# Patient Record
Sex: Female | Born: 1945 | Race: Black or African American | Hispanic: No | Smoking: Never smoker
Health system: Southern US, Community
[De-identification: ages and names within clinical notes are randomized; demographics above are authoritative.]

## PROBLEM LIST (undated history)

## (undated) DIAGNOSIS — C50919 Malignant neoplasm of unspecified site of unspecified female breast: Secondary | ICD-10-CM

## (undated) DIAGNOSIS — C801 Malignant (primary) neoplasm, unspecified: Secondary | ICD-10-CM

## (undated) DIAGNOSIS — I639 Cerebral infarction, unspecified: Secondary | ICD-10-CM

## (undated) DIAGNOSIS — I10 Essential (primary) hypertension: Secondary | ICD-10-CM

## (undated) DIAGNOSIS — Z923 Personal history of irradiation: Secondary | ICD-10-CM

## (undated) DIAGNOSIS — Z9221 Personal history of antineoplastic chemotherapy: Secondary | ICD-10-CM

## (undated) HISTORY — PX: FRACTURE SURGERY: SHX138

## (undated) HISTORY — PX: TONSILLECTOMY: SUR1361

## (undated) HISTORY — PX: ABDOMINAL HYSTERECTOMY: SHX81

## (undated) HISTORY — PX: ANKLE FRACTURE SURGERY: SHX122

## (undated) HISTORY — PX: LEG SURGERY: SHX1003

## (undated) HISTORY — DX: Malignant (primary) neoplasm, unspecified: C80.1

## (undated) HISTORY — PX: PORTACATH PLACEMENT: SHX2246

## (undated) HISTORY — DX: Essential (primary) hypertension: I10

## (undated) HISTORY — DX: Cerebral infarction, unspecified: I63.9

## (undated) HISTORY — PX: FINGER SURGERY: SHX640

## (undated) HISTORY — PX: CHOLECYSTECTOMY: SHX55

## (undated) HISTORY — PX: BREAST SURGERY: SHX581

---

## 1982-10-01 HISTORY — PX: FACIAL FRACTURE SURGERY: SHX1570

## 2005-02-21 ENCOUNTER — Ambulatory Visit: Payer: Self-pay | Admitting: General Practice

## 2006-01-18 ENCOUNTER — Other Ambulatory Visit: Payer: Self-pay

## 2006-01-18 ENCOUNTER — Emergency Department: Payer: Self-pay | Admitting: Emergency Medicine

## 2006-04-18 ENCOUNTER — Ambulatory Visit: Payer: Self-pay | Admitting: General Practice

## 2007-04-24 ENCOUNTER — Ambulatory Visit: Payer: Self-pay | Admitting: Endocrinology

## 2008-06-17 DIAGNOSIS — I10 Essential (primary) hypertension: Secondary | ICD-10-CM | POA: Insufficient documentation

## 2008-06-17 DIAGNOSIS — E669 Obesity, unspecified: Secondary | ICD-10-CM | POA: Insufficient documentation

## 2008-06-17 DIAGNOSIS — E785 Hyperlipidemia, unspecified: Secondary | ICD-10-CM | POA: Insufficient documentation

## 2008-08-10 ENCOUNTER — Ambulatory Visit: Payer: Self-pay

## 2009-02-17 DIAGNOSIS — J309 Allergic rhinitis, unspecified: Secondary | ICD-10-CM | POA: Insufficient documentation

## 2009-08-23 ENCOUNTER — Ambulatory Visit: Payer: Self-pay | Admitting: Nurse Practitioner

## 2009-08-29 ENCOUNTER — Ambulatory Visit: Payer: Self-pay | Admitting: Nurse Practitioner

## 2009-09-29 ENCOUNTER — Ambulatory Visit: Payer: Self-pay | Admitting: Surgery

## 2009-10-01 ENCOUNTER — Ambulatory Visit: Payer: Self-pay | Admitting: Oncology

## 2009-10-01 DIAGNOSIS — C50919 Malignant neoplasm of unspecified site of unspecified female breast: Secondary | ICD-10-CM

## 2009-10-01 DIAGNOSIS — C801 Malignant (primary) neoplasm, unspecified: Secondary | ICD-10-CM

## 2009-10-01 HISTORY — PX: BREAST LUMPECTOMY: SHX2

## 2009-10-01 HISTORY — PX: BREAST BIOPSY: SHX20

## 2009-10-01 HISTORY — DX: Malignant (primary) neoplasm, unspecified: C80.1

## 2009-10-01 HISTORY — DX: Malignant neoplasm of unspecified site of unspecified female breast: C50.919

## 2009-10-04 ENCOUNTER — Ambulatory Visit: Payer: Self-pay | Admitting: Surgery

## 2009-10-14 ENCOUNTER — Ambulatory Visit: Payer: Self-pay | Admitting: Internal Medicine

## 2009-10-21 ENCOUNTER — Ambulatory Visit: Payer: Self-pay | Admitting: Surgery

## 2009-10-29 ENCOUNTER — Emergency Department: Payer: Self-pay | Admitting: Internal Medicine

## 2009-11-01 ENCOUNTER — Ambulatory Visit: Payer: Self-pay | Admitting: Internal Medicine

## 2009-11-01 ENCOUNTER — Ambulatory Visit: Payer: Self-pay | Admitting: Oncology

## 2009-11-29 ENCOUNTER — Ambulatory Visit: Payer: Self-pay | Admitting: Internal Medicine

## 2009-11-29 ENCOUNTER — Ambulatory Visit: Payer: Self-pay | Admitting: Oncology

## 2009-12-30 ENCOUNTER — Ambulatory Visit: Payer: Self-pay | Admitting: Oncology

## 2009-12-30 ENCOUNTER — Ambulatory Visit: Payer: Self-pay | Admitting: Internal Medicine

## 2010-01-08 ENCOUNTER — Emergency Department: Payer: Self-pay | Admitting: Emergency Medicine

## 2010-01-29 ENCOUNTER — Ambulatory Visit: Payer: Self-pay | Admitting: Oncology

## 2010-01-29 ENCOUNTER — Ambulatory Visit: Payer: Self-pay | Admitting: Internal Medicine

## 2010-02-07 DIAGNOSIS — C50919 Malignant neoplasm of unspecified site of unspecified female breast: Secondary | ICD-10-CM | POA: Insufficient documentation

## 2010-03-01 ENCOUNTER — Ambulatory Visit: Payer: Self-pay | Admitting: Oncology

## 2010-03-01 ENCOUNTER — Ambulatory Visit: Payer: Self-pay | Admitting: Internal Medicine

## 2010-03-31 ENCOUNTER — Ambulatory Visit: Payer: Self-pay | Admitting: Internal Medicine

## 2010-03-31 ENCOUNTER — Ambulatory Visit: Payer: Self-pay | Admitting: Oncology

## 2010-05-01 ENCOUNTER — Ambulatory Visit: Payer: Self-pay | Admitting: Internal Medicine

## 2010-05-01 ENCOUNTER — Ambulatory Visit: Payer: Self-pay | Admitting: Oncology

## 2010-06-01 ENCOUNTER — Ambulatory Visit: Payer: Self-pay | Admitting: Internal Medicine

## 2010-06-01 ENCOUNTER — Ambulatory Visit: Payer: Self-pay | Admitting: Oncology

## 2010-07-01 ENCOUNTER — Ambulatory Visit: Payer: Self-pay | Admitting: Internal Medicine

## 2010-07-01 ENCOUNTER — Ambulatory Visit: Payer: Self-pay | Admitting: Oncology

## 2010-08-01 ENCOUNTER — Ambulatory Visit: Payer: Self-pay | Admitting: Internal Medicine

## 2010-08-31 ENCOUNTER — Ambulatory Visit: Payer: Self-pay | Admitting: Oncology

## 2010-09-18 ENCOUNTER — Ambulatory Visit: Payer: Self-pay | Admitting: Oncology

## 2010-09-28 LAB — CANCER ANTIGEN 27.29

## 2010-10-01 ENCOUNTER — Ambulatory Visit: Payer: Self-pay | Admitting: Oncology

## 2010-10-20 IMAGING — CT CT CHEST-ABD-PELV W/ CM
1 of 2 series · 14 of 31 positions shown, 18 images · non-contrast
Comparison: none

REASON FOR EXAM: staging breast CA
COMMENTS:

[Series 2: soft tissue · axial · 0.78mm/px · z∈[-852,-282]mm · 14 of 128 slices shown, 18 images]
[im 7/128  mediastinal]
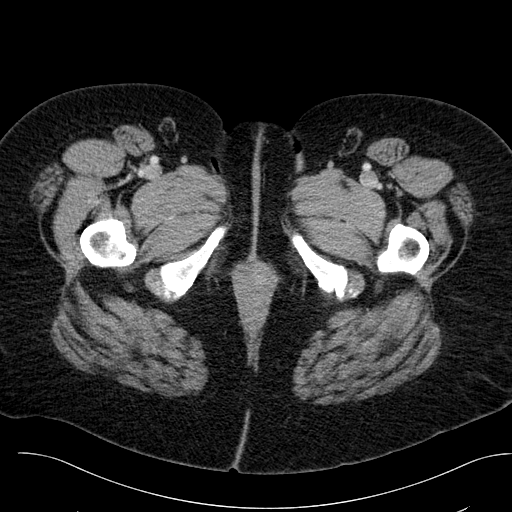
[im 7/128  bone]
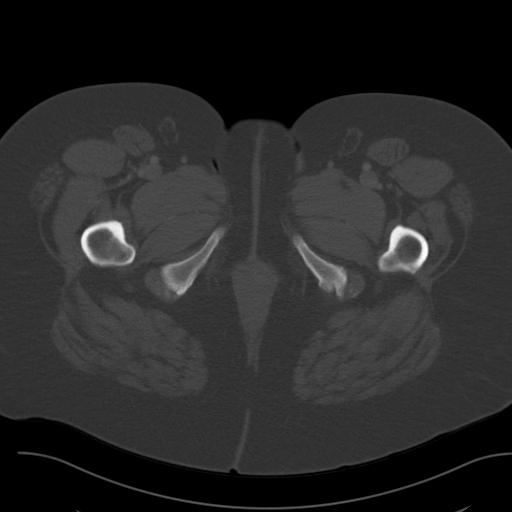
[im 21/128  mediastinal]
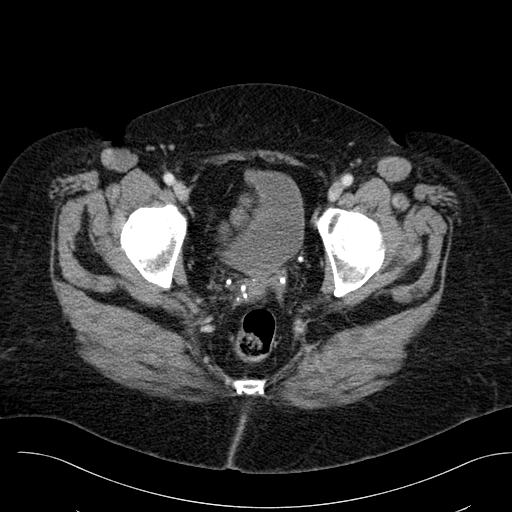
[im 34/128  mediastinal]
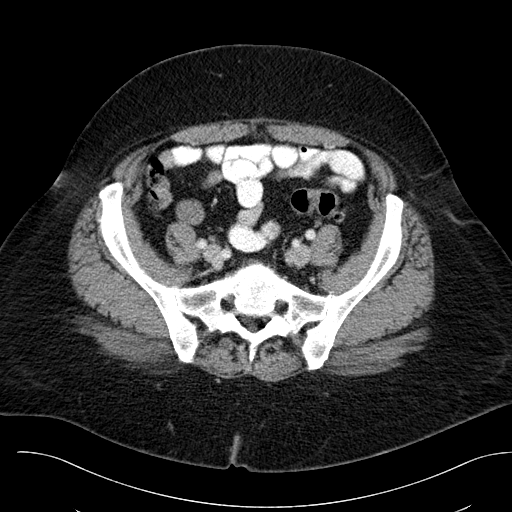
[im 43/128  mediastinal]
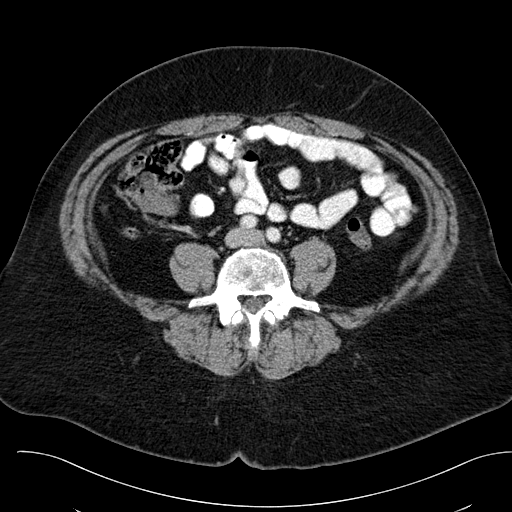
[im 47/128  mediastinal]
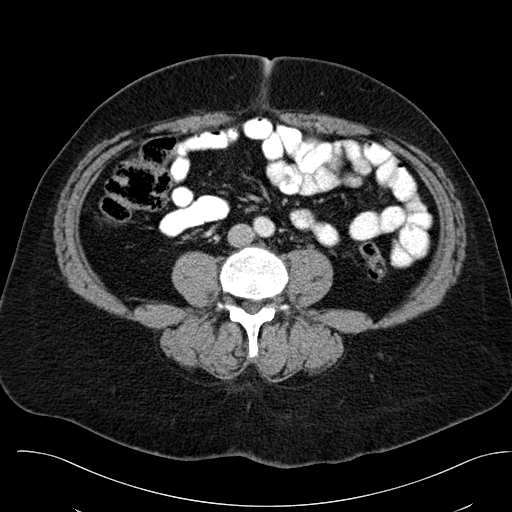
[im 61/128  mediastinal]
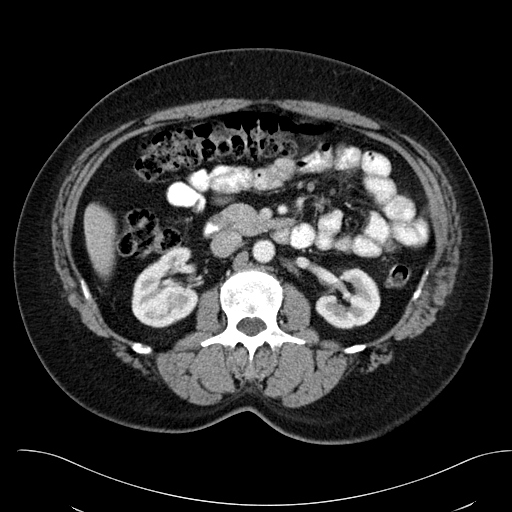
[im 67/128  mediastinal]
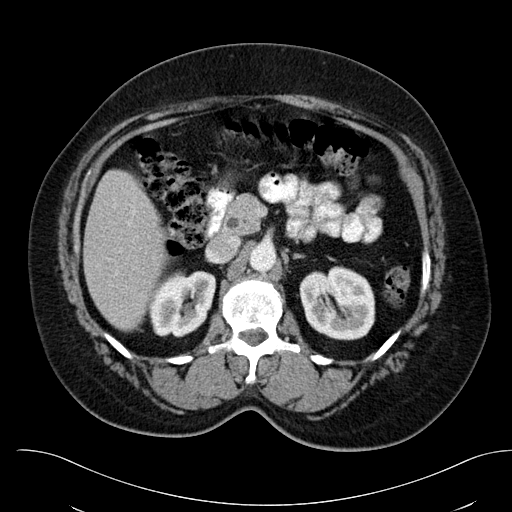
[im 81/128  mediastinal]
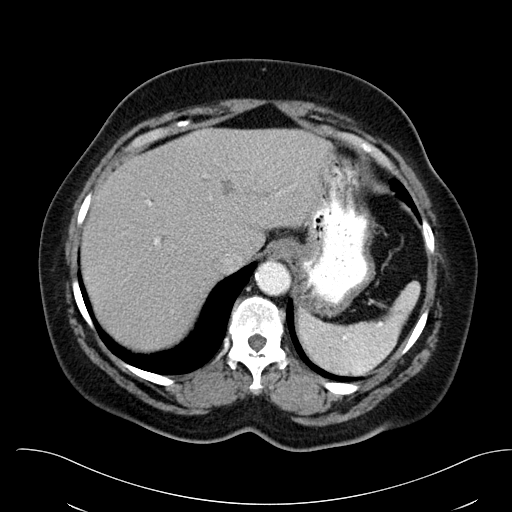
[im 85/128  mediastinal]
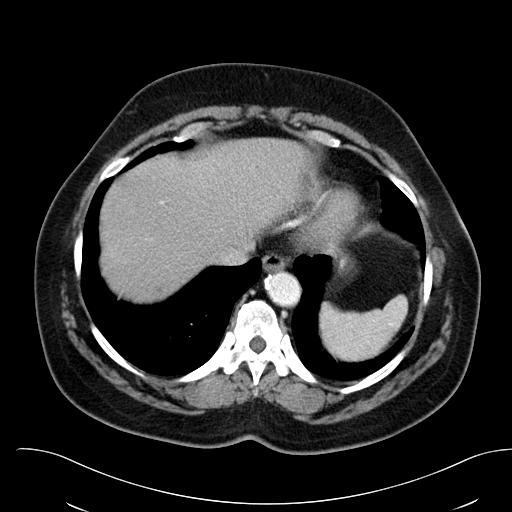
[im 85/128  bone]
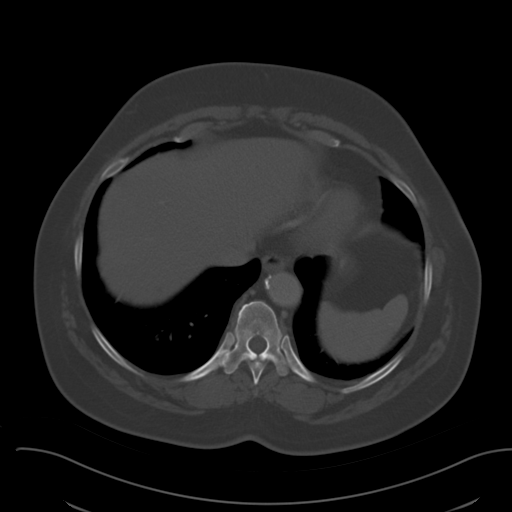
[im 94/128  mediastinal]
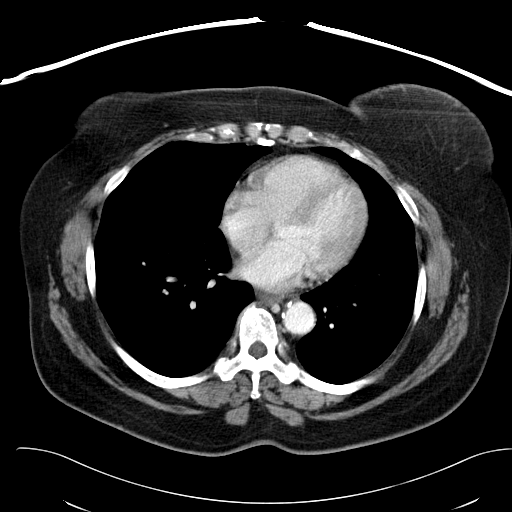
[im 101/128  lung]
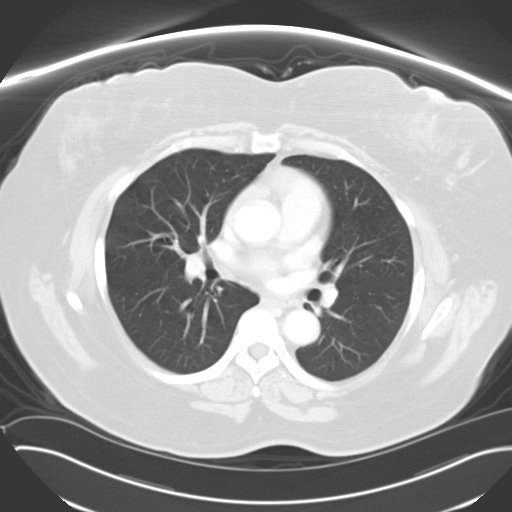
[im 107/128  mediastinal]
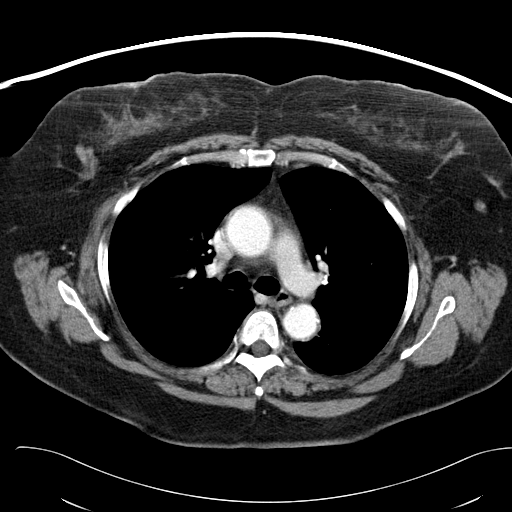
[im 107/128  lung]
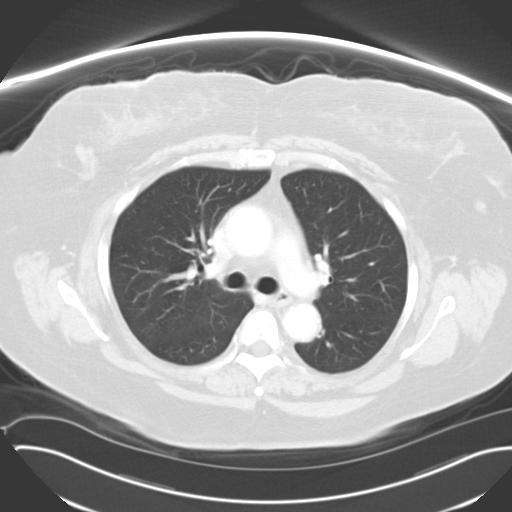
[im 114/128  lung]
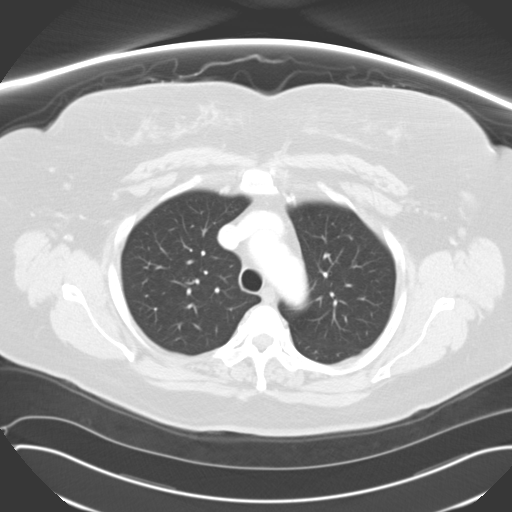
[im 121/128  mediastinal]
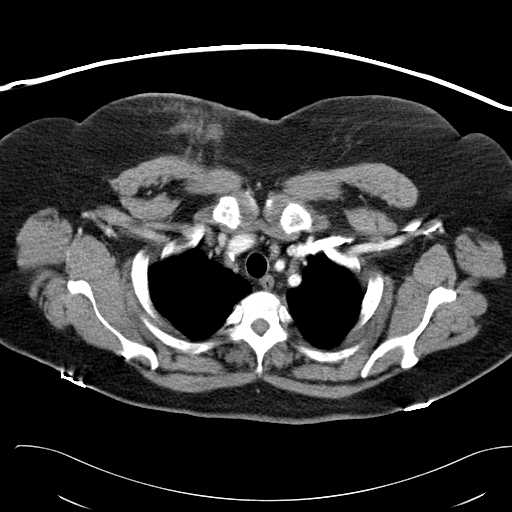
[im 121/128  lung]
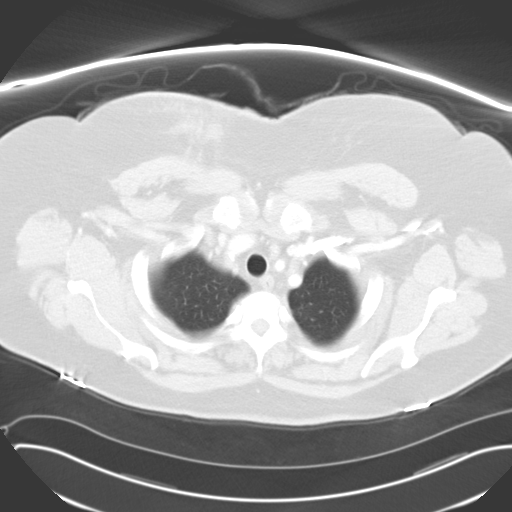

[14 of 31 positions shown; findings below may reference images not displayed]

PROCEDURE:     CT  - CT CHEST ABDOMEN AND PELVIS W  - October 18, 2009  [DATE]

RESULT:     CT of the chest, abdomen and pelvis is performed utilizing 100
ml of Ssovue-X0K iodinated intravenous contrast along with oral contrast.
The patient has no previous exam for comparison. Images are reconstructed in
the axial plane at 5 mm slice thickness. Multiplanar reconstructions were
performed at the time of interpretation by the interpreting physician
utilizing the [employer] software. Again, there is no previous exam for
comparison.

There is some linear fibrosis or atelectasis at both lung bases along with
an irregularly marginated tiny nodular density in the left lung seen in the
area of image #39. This is only 1 to possibly 2 mm in diameter and is likely
fibrotic. Follow-up on subsequent scans is suggested.

Punctate calcification is seen in the superior portion of the spleen
suggestive of granulomatous change. No mediastinal or hilar mass or
adenopathy is present. There is no axillary mass or adenopathy evident. The
bony structures show degenerative changes. The gallbladder wall appears to
be slightly thickened which may be artifactual. Ultrasound correlation is
recommended if the patient has right upper quadrant symptoms. No radiopaque
gallstones are evident. The liver shows no definite focal mass. The kidneys
enhance normally. The adrenal glands, spleen and pancreas are otherwise
unremarkable. There is no abnormal bowel distention. There is no mesenteric
or retroperitoneal mass or adenopathy. No pelvic or inguinal mass or
adenopathy is evident. It appears the images demonstrate a cystic area in
the pelvis in the midline and to the right which measures 2.61 cm. A right
adnexal cyst is suspected. Pelvic ultrasound follow-up is recommended.
Multiple pelvic calcifications suggest phleboliths. The appendix appears
normal. There is no abnormal bowel distention or bowel wall thickening.
IMPRESSION: 1. Unremarkable appearance of the thorax except for the postsurgical changes
in the right breast.
2. Right pelvic cyst, possibly ovarian. Pelvic sonogram follow-up is
recommended.
3. Gallbladder wall appears to be slightly thickened which may be
artifactual or secondary to incomplete distention. Ultrasound correlation
may be beneficial to assess the gallbladder wall.
4. Probable areas of linear atelectasis or fibrosis in the lungs especially
in the lingula. Follow-up is recommended on subsequent scans.

## 2010-10-31 IMAGING — CT CT HEAD WITHOUT CONTRAST
2 series · 16 of 30 positions shown, 20 images · non-contrast
Comparison: none

REASON FOR EXAM: dizzy breast cancer
COMMENTS:

PROCEDURE:     CT  - CT HEAD WITHOUT CONTRAST  - October 29, 2009  [DATE]
RESULT:     Technique: Helical 5mm sections were obtained from the skull
base to the vertex without administration of intravenous contrast.

[Series 2: without · axial · non-contrast · 0.43mm/px · z∈[-178,-43]mm · 13 of 33 slices shown, 17 images]
[im 3/33  brain]
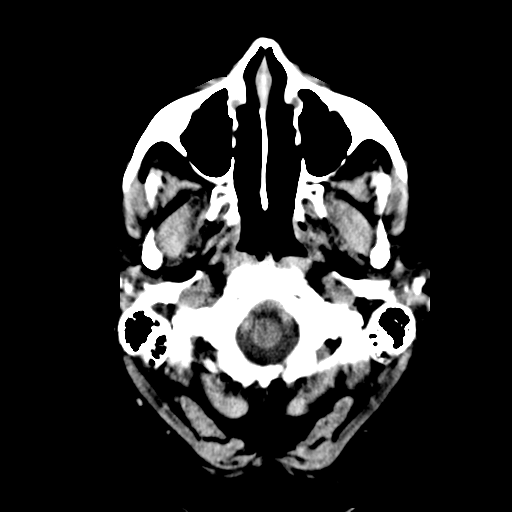
[im 3/33  bone]
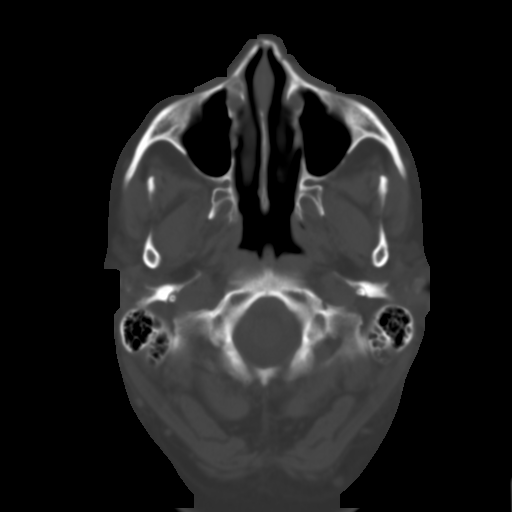
[im 5/33  brain]
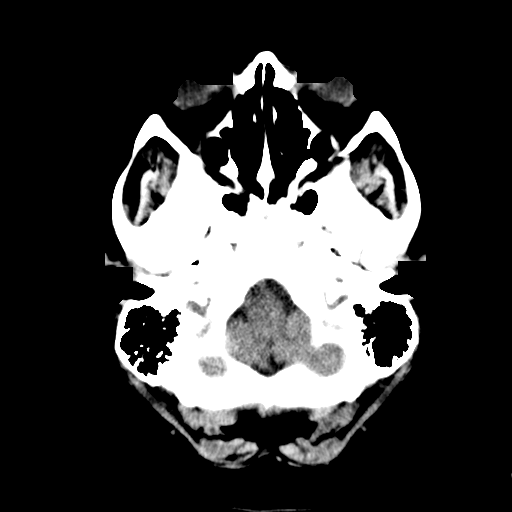
[im 7/33  brain]
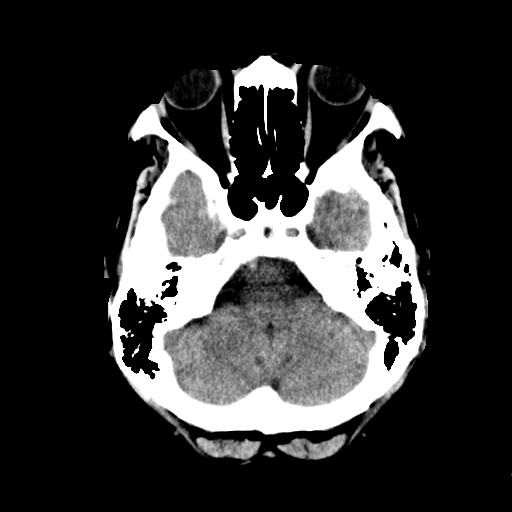
[im 10/33  brain]
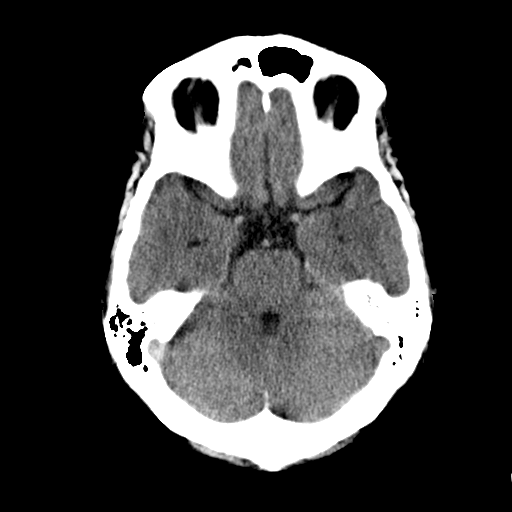
[im 12/33  brain]
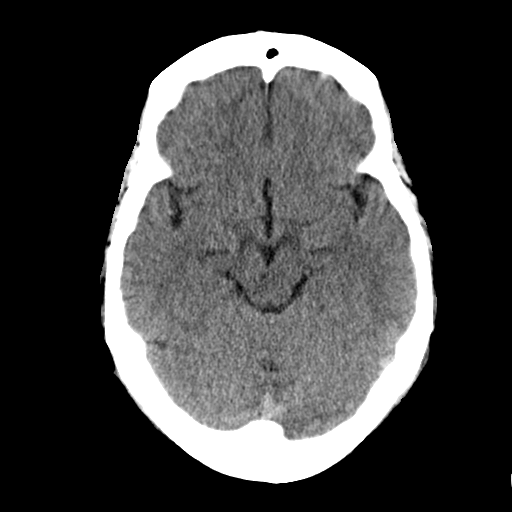
[im 12/33  bone]
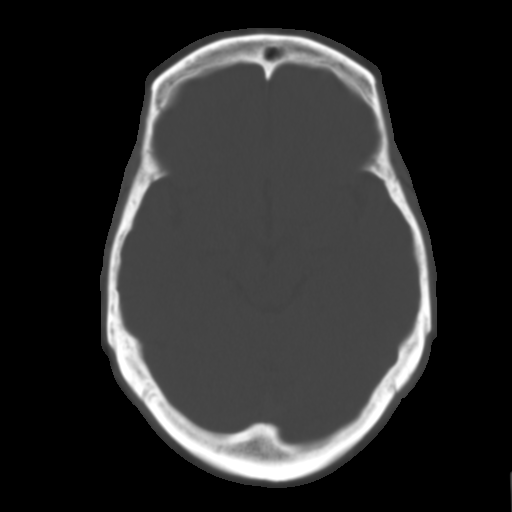
[im 14/33  brain]
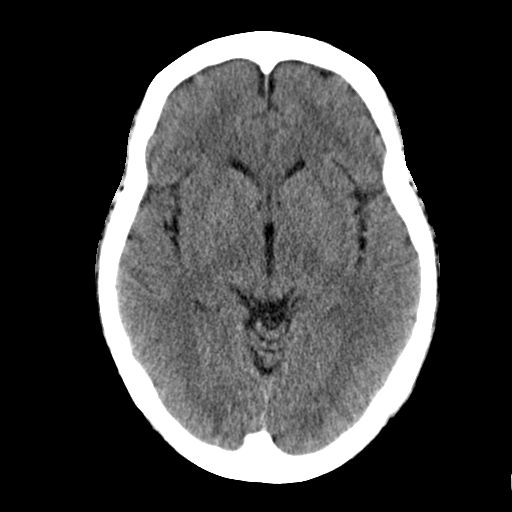
[im 17/33  brain]
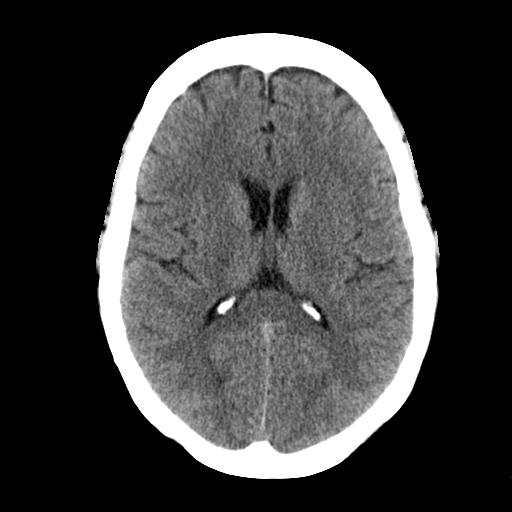
[im 19/33  brain]
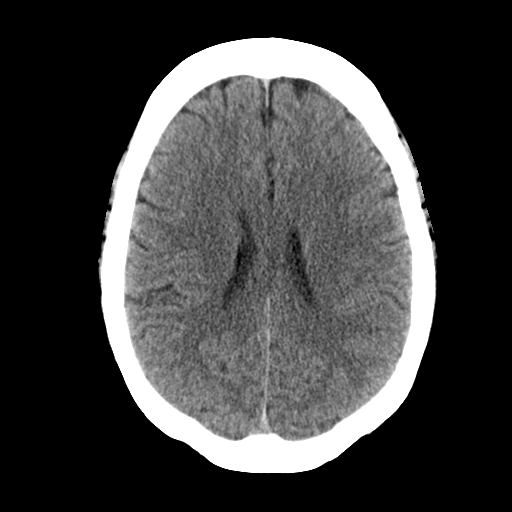
[im 21/33  brain]
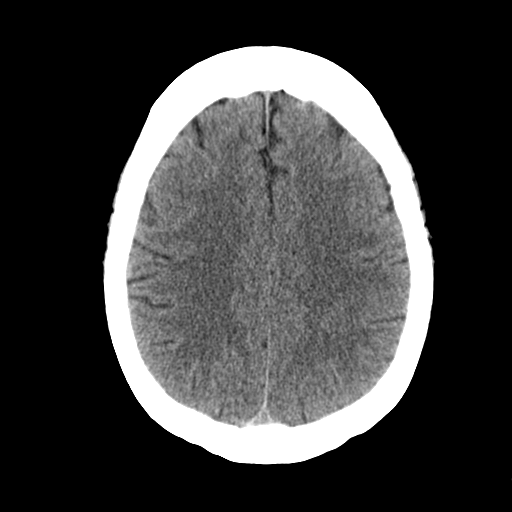
[im 21/33  bone]
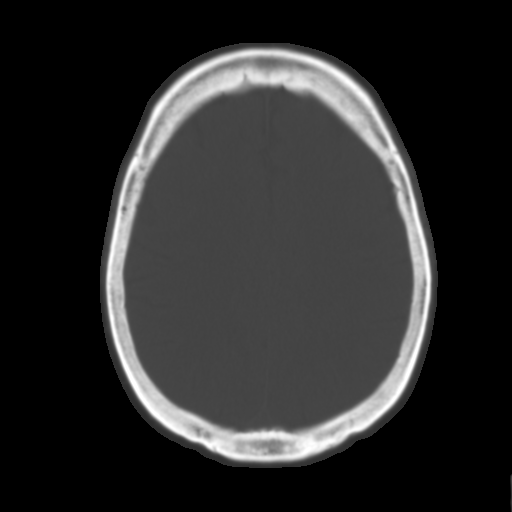
[im 23/33  brain]
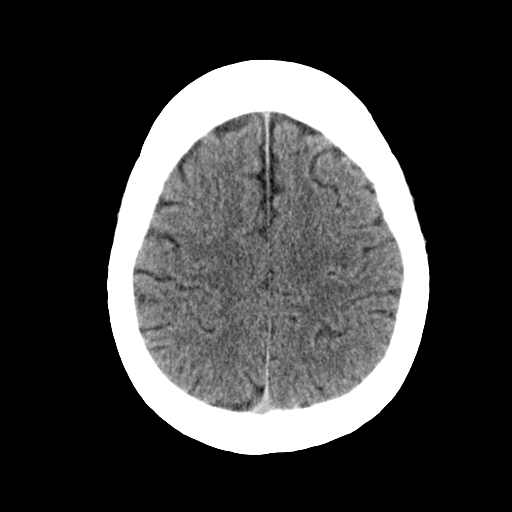
[im 26/33  brain]
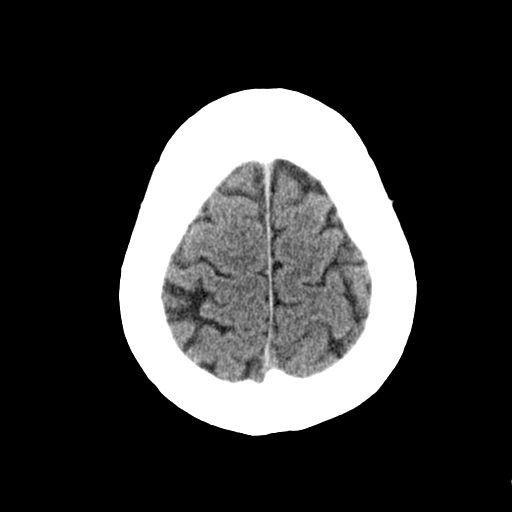
[im 28/33  brain]
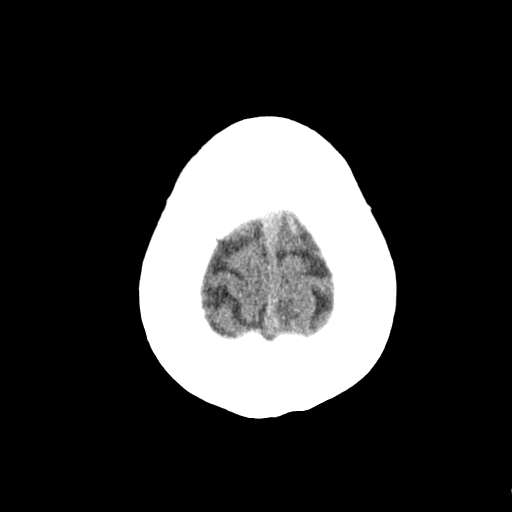
[im 30/33  brain]
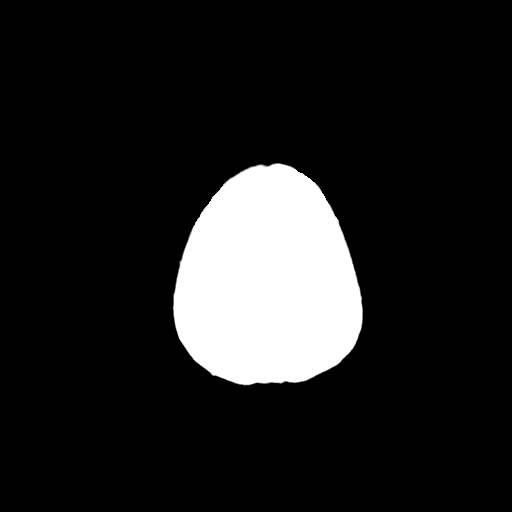
[im 30/33  bone]
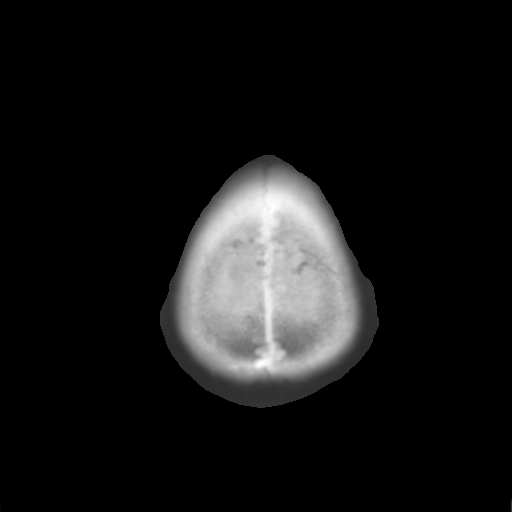

[Series 3: bone · axial · 0.43mm/px · z∈[-178,-133]mm · 3 of 33 slices shown]
[im 3/33  bone]
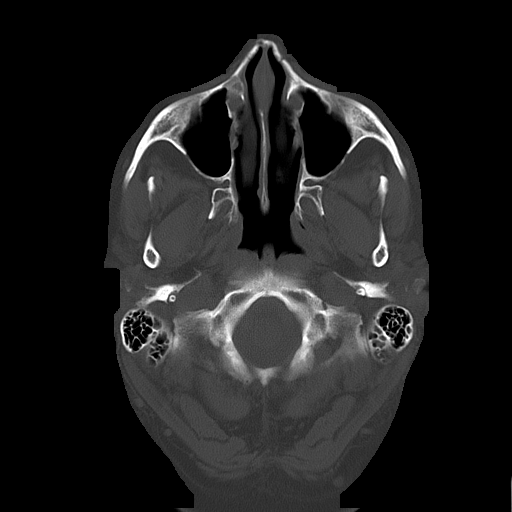
[im 7/33  bone]
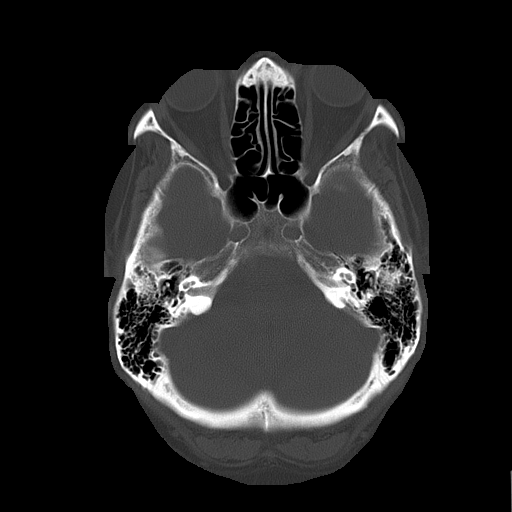
[im 12/33  bone]
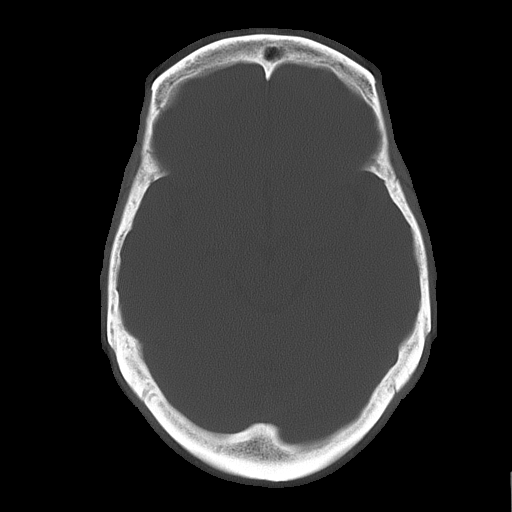

[16 of 30 positions shown; findings below may reference images not displayed]

FINDINGS: There is not evidence of intra-axial fluid collections. There is
no evidence of acute hemorrhage or secondary signs reflecting mass effect or
subacute or chronic focal territorial infarction. The osseous structures
demonstrate no evidence of a depressed skull fracture. If there is
persistent concern clinical follow-up with MRI is recommended.
IMPRESSION: 1. No evidence of acute intracranial abnormalitites.

## 2010-11-01 ENCOUNTER — Ambulatory Visit: Payer: Self-pay | Admitting: Oncology

## 2010-11-23 ENCOUNTER — Inpatient Hospital Stay: Payer: Self-pay | Admitting: Surgery

## 2010-11-24 LAB — CANCER ANTIGEN 27.29: CA 27.29: 26.2 U/mL (ref 0.0–38.6)

## 2010-11-30 ENCOUNTER — Ambulatory Visit: Payer: Self-pay | Admitting: Oncology

## 2010-12-05 ENCOUNTER — Ambulatory Visit: Payer: Self-pay | Admitting: Surgery

## 2010-12-20 ENCOUNTER — Ambulatory Visit: Payer: Self-pay | Admitting: Surgery

## 2010-12-31 ENCOUNTER — Ambulatory Visit: Payer: Self-pay | Admitting: Oncology

## 2011-01-30 ENCOUNTER — Ambulatory Visit: Payer: Self-pay | Admitting: Oncology

## 2011-02-21 LAB — CANCER ANTIGEN 27.29: CA 27.29: 17.4 U/mL (ref 0.0–38.6)

## 2011-03-02 ENCOUNTER — Ambulatory Visit: Payer: Self-pay | Admitting: Oncology

## 2011-04-05 ENCOUNTER — Ambulatory Visit: Payer: Self-pay | Admitting: Oncology

## 2011-05-02 ENCOUNTER — Ambulatory Visit: Payer: Self-pay | Admitting: Oncology

## 2011-05-14 ENCOUNTER — Ambulatory Visit: Payer: Self-pay | Admitting: Oncology

## 2011-06-20 ENCOUNTER — Ambulatory Visit: Payer: Self-pay | Admitting: Oncology

## 2011-07-02 ENCOUNTER — Ambulatory Visit: Payer: Self-pay | Admitting: Oncology

## 2011-09-12 ENCOUNTER — Ambulatory Visit: Payer: Self-pay | Admitting: Oncology

## 2011-10-02 ENCOUNTER — Ambulatory Visit: Payer: Self-pay | Admitting: Oncology

## 2011-11-02 ENCOUNTER — Ambulatory Visit: Payer: Self-pay | Admitting: Oncology

## 2011-12-13 ENCOUNTER — Observation Stay: Payer: Self-pay | Admitting: Internal Medicine

## 2011-12-13 LAB — CBC
HGB: 12.6 g/dL (ref 12.0–16.0)
MCHC: 33.1 g/dL (ref 32.0–36.0)
Platelet: 224 10*3/uL (ref 150–440)
RBC: 4.37 10*6/uL (ref 3.80–5.20)
RDW: 13.9 % (ref 11.5–14.5)
WBC: 7.8 10*3/uL (ref 3.6–11.0)

## 2011-12-13 LAB — COMPREHENSIVE METABOLIC PANEL
Albumin: 4.1 g/dL (ref 3.4–5.0)
Alkaline Phosphatase: 48 U/L — ABNORMAL LOW (ref 50–136)
BUN: 14 mg/dL (ref 7–18)
Bilirubin,Total: 0.6 mg/dL (ref 0.2–1.0)
Chloride: 98 mmol/L (ref 98–107)
Creatinine: 0.62 mg/dL (ref 0.60–1.30)
EGFR (African American): >60
EGFR (Non-African Amer.): >60
Glucose: 106 mg/dL — ABNORMAL HIGH (ref 65–99)
Osmolality: 280 (ref 275–301)
SGOT(AST): 32 U/L (ref 15–37)
SGPT (ALT): 21 U/L

## 2011-12-13 LAB — LIPID PANEL
Ldl Cholesterol, Calc: 173 mg/dL — ABNORMAL HIGH (ref 0–100)
VLDL Cholesterol, Calc: 14 mg/dL (ref 5–40)

## 2011-12-13 LAB — APTT: Activated PTT: 33.7 secs (ref 23.6–35.9)

## 2011-12-13 LAB — PROTIME-INR: Prothrombin Time: 12.6 secs (ref 11.5–14.7)

## 2011-12-13 LAB — TROPONIN I: Troponin-I: <0.02 ng/mL

## 2011-12-14 LAB — CK TOTAL AND CKMB (NOT AT ARMC)
CK, Total: 117 U/L (ref 21–215)
CK-MB: 0.8 ng/mL (ref 0.5–3.6)

## 2011-12-14 LAB — TROPONIN I: Troponin-I: <0.02 ng/mL

## 2011-12-21 ENCOUNTER — Ambulatory Visit: Payer: Self-pay | Admitting: Oncology

## 2011-12-31 ENCOUNTER — Ambulatory Visit: Payer: Self-pay | Admitting: Oncology

## 2011-12-31 DIAGNOSIS — Z8673 Personal history of transient ischemic attack (TIA), and cerebral infarction without residual deficits: Secondary | ICD-10-CM | POA: Insufficient documentation

## 2012-02-13 ENCOUNTER — Ambulatory Visit: Payer: Self-pay | Admitting: Oncology

## 2012-03-01 ENCOUNTER — Ambulatory Visit: Payer: Self-pay | Admitting: Oncology

## 2012-04-15 ENCOUNTER — Ambulatory Visit: Payer: Self-pay | Admitting: Oncology

## 2012-05-07 ENCOUNTER — Ambulatory Visit: Payer: Self-pay | Admitting: Oncology

## 2012-06-01 ENCOUNTER — Ambulatory Visit: Payer: Self-pay | Admitting: Oncology

## 2012-06-19 LAB — CBC CANCER CENTER
Basophil #: 0.1 x10 3/mm (ref 0.0–0.1)
Basophil %: 1 %
Eosinophil %: 2 %
HCT: 36.4 % (ref 35.0–47.0)
HGB: 11.7 g/dL — ABNORMAL LOW (ref 12.0–16.0)
Lymphocyte #: 1.1 x10 3/mm (ref 1.0–3.6)
MCH: 27.8 pg (ref 26.0–34.0)
MCV: 87 fL (ref 80–100)
Monocyte #: 0.4 x10 3/mm (ref 0.2–0.9)
Neutrophil #: 5.3 x10 3/mm (ref 1.4–6.5)
Neutrophil %: 74.9 %
RBC: 4.21 10*6/uL (ref 3.80–5.20)
RDW: 14.5 % (ref 11.5–14.5)

## 2012-06-19 LAB — BASIC METABOLIC PANEL
Anion Gap: 5 — ABNORMAL LOW (ref 7–16)
BUN: 10 mg/dL (ref 7–18)
Creatinine: 0.84 mg/dL (ref 0.60–1.30)
EGFR (Non-African Amer.): >60
Glucose: 110 mg/dL — ABNORMAL HIGH (ref 65–99)
Osmolality: 281 (ref 275–301)
Potassium: 3.7 mmol/L (ref 3.5–5.1)

## 2012-07-01 ENCOUNTER — Ambulatory Visit: Payer: Self-pay | Admitting: Oncology

## 2012-08-01 ENCOUNTER — Ambulatory Visit: Payer: Self-pay | Admitting: Oncology

## 2012-12-16 ENCOUNTER — Ambulatory Visit: Payer: Self-pay | Admitting: Oncology

## 2012-12-30 ENCOUNTER — Ambulatory Visit: Payer: Self-pay | Admitting: Oncology

## 2013-01-29 ENCOUNTER — Ambulatory Visit: Payer: Self-pay | Admitting: Oncology

## 2013-03-01 ENCOUNTER — Ambulatory Visit: Payer: Self-pay | Admitting: Oncology

## 2013-04-16 ENCOUNTER — Ambulatory Visit: Payer: Self-pay | Admitting: Oncology

## 2014-02-03 ENCOUNTER — Ambulatory Visit: Payer: Self-pay | Admitting: Oncology

## 2014-02-04 LAB — CANCER ANTIGEN 27.29: CA 27.29: 15.2 U/mL (ref 0.0–38.6)

## 2014-03-01 ENCOUNTER — Ambulatory Visit: Payer: Self-pay | Admitting: Oncology

## 2014-04-19 ENCOUNTER — Ambulatory Visit: Payer: Self-pay | Admitting: Oncology

## 2014-05-26 ENCOUNTER — Ambulatory Visit: Payer: Self-pay | Admitting: Oncology

## 2014-06-01 ENCOUNTER — Ambulatory Visit: Payer: Self-pay | Admitting: Oncology

## 2014-07-07 ENCOUNTER — Ambulatory Visit: Payer: Self-pay | Admitting: Oncology

## 2014-07-07 LAB — COMPREHENSIVE METABOLIC PANEL
ALBUMIN: 3.5 g/dL (ref 3.4–5.0)
ALK PHOS: 54 U/L
AST: 18 U/L (ref 15–37)
Anion Gap: 6 — ABNORMAL LOW (ref 7–16)
BUN: 14 mg/dL (ref 7–18)
Bilirubin,Total: 0.4 mg/dL (ref 0.2–1.0)
CO2: 32 mmol/L (ref 21–32)
Calcium, Total: 9 mg/dL (ref 8.5–10.1)
Chloride: 102 mmol/L (ref 98–107)
Creatinine: 0.86 mg/dL (ref 0.60–1.30)
EGFR (African American): >60
Glucose: 116 mg/dL — ABNORMAL HIGH (ref 65–99)
Osmolality: 281 (ref 275–301)
Potassium: 3.6 mmol/L (ref 3.5–5.1)
SGPT (ALT): 24 U/L
SODIUM: 140 mmol/L (ref 136–145)
Total Protein: 7.2 g/dL (ref 6.4–8.2)

## 2014-07-07 LAB — CBC CANCER CENTER
BASOS ABS: 0.1 x10 3/mm (ref 0.0–0.1)
Basophil %: 1.3 %
Eosinophil #: 0.2 x10 3/mm (ref 0.0–0.7)
Eosinophil %: 2.6 %
HCT: 36.3 % (ref 35.0–47.0)
HGB: 11.9 g/dL — ABNORMAL LOW (ref 12.0–16.0)
Lymphocyte #: 1.4 x10 3/mm (ref 1.0–3.6)
Lymphocyte %: 18.7 %
MCH: 28 pg (ref 26.0–34.0)
MCHC: 32.7 g/dL (ref 32.0–36.0)
MCV: 86 fL (ref 80–100)
MONO ABS: 0.5 x10 3/mm (ref 0.2–0.9)
Monocyte %: 6.8 %
NEUTROS ABS: 5.3 x10 3/mm (ref 1.4–6.5)
Neutrophil %: 70.6 %
Platelet: 253 x10 3/mm (ref 150–440)
RBC: 4.23 10*6/uL (ref 3.80–5.20)
RDW: 14.3 % (ref 11.5–14.5)
WBC: 7.4 x10 3/mm (ref 3.6–11.0)

## 2014-07-08 LAB — CANCER ANTIGEN 27.29: CA 27.29: 13.9 U/mL (ref 0.0–38.6)

## 2014-08-01 ENCOUNTER — Ambulatory Visit: Payer: Self-pay | Admitting: Oncology

## 2014-08-31 ENCOUNTER — Ambulatory Visit: Payer: Self-pay | Admitting: Oncology

## 2014-11-12 ENCOUNTER — Ambulatory Visit: Payer: Self-pay | Admitting: Oncology

## 2014-11-30 ENCOUNTER — Ambulatory Visit
Admit: 2014-11-30 | Disposition: A | Discharge status: Home / Self Care | Payer: Self-pay | Attending: Oncology | Admitting: Oncology

## 2015-01-14 ENCOUNTER — Other Ambulatory Visit: Payer: Self-pay | Admitting: Oncology

## 2015-01-14 DIAGNOSIS — Z853 Personal history of malignant neoplasm of breast: Secondary | ICD-10-CM

## 2015-01-23 NOTE — H&P (Signed)
PATIENT NAME:  NIKO, JAKEL MR#:  [id number] DATE OF BIRTH:  1946-08-23  DATE OF ADMISSION:  12/13/2011  PRIMARY CARE PHYSICIAN: [facility]   CHIEF COMPLAINT: Difficulty talking with weakness on the left upper and lower extremity today.   HISTORY OF PRESENT ILLNESS: Ms. Oriol is a very pleasant 70 year old African American female with past medical history of breast cancer and history of hypertension with borderline hyperlipidemia comes to the Emergency Room with complaints of weakness in the left upper and lower extremity when she woke up this morning around 7:00 a.m. Patient noted she had difficulty expressing her thoughts and was not able to talk along with weakness in her left arm and lower extremity. She did not have a fall although she did have to hold the wall in her house to get around. She went to [facility], was taken by her neighbor and she was then sent to the Emergency Room for symptoms suggestive of transient ischemic attack/stroke. Patient's symptoms have resolved. Her CT head is negative for stroke. She received a dose of aspirin 325 mg in the Emergency Room. She is hemodynamically stable and being admitted for further evaluation and management.   PAST MEDICAL HISTORY:  1. Hyperlipidemia, does not take any medications.  2. Borderline diabetes, is not on medicine. Patient says she watches her diet.  3. History of breast cancer status post radiation and chemotherapy.  4. Hysterectomy.  5. History of breast surgery in the past.   FAMILY HISTORY: Father died of lung cancer. Mother died of myocardial infarction.   SOCIAL HISTORY: Nonsmoker. Nonalcoholic. Does not work.   REVIEW OF SYSTEMS: CONSTITUTIONAL: No fever, fatigue. Positive for weakness in left upper and lower extremity. EYES: Minimal blurred vision which resolved. No glaucoma or cataracts. ENT: No tinnitus, ear pain, hearing loss. RESPIRATORY: No cough, wheeze, hemoptysis. CARDIOVASCULAR: No chest pain,  orthopnea, edema. GASTROINTESTINAL: No nausea, vomiting, diarrhea, abdominal pain. GENITOURINARY: No dysuria, hematuria. ENDOCRINE: No polyuria, nocturia. HEMATOLOGY: No anemia or easy bruising. SKIN: No acne, rash. MUSCULOSKELETAL: Positive for weakness in left upper and lower extremity. NEUROLOGICAL: Positive for transient ischemic attack with expressive aphasia. PSYCH: No anxiety or depression. All other systems reviewed and negative.   MEDICATIONS:  1. Multivitamin p.o. daily.  2. Lisinopril/hydrochlorothiazide 20/25, 1 tablet daily.  3. Atenolol 25 mg daily.  4. Letrozole 2.5 mg p.o. daily.   PHYSICAL EXAMINATION:  GENERAL: Patient is awake, alert, oriented x3, not in acute distress.   VITAL SIGNS: Afebrile, pulse 82, blood pressure 149/89, sats 98% on room air.   HEENT: Atraumatic, normocephalic. Pupils are equal, round, and reactive to light and accommodation. Extraocular movements intact. Oral mucosa is moist.   NECK: Supple. No JVD. No carotid bruits.   RESPIRATORY: Clear to auscultation bilaterally. No rales, rhonchi, respiratory distress, or labored breathing.   CARDIOVASCULAR: Both the heart sounds are normal. Rate, rhythm is regular. PMI not lateralized. Chest nontender.   EXTREMITIES: Good pedal pulses, good femoral pulses. No lower extremity edema.   ABDOMEN: Soft, benign, nontender. No organomegaly. Positive bowel sounds.   NEUROLOGIC: Patient is right handed. Her cranial nerves II through XII appears grossly intact. No facial droop. She has speech which is clear. Left upper and lower extremity 4+/5 power with reflexes 1+ in both upper and lower extremities. Plantars downgoing. Sensory exam appears to be normal. Gait not tested.   PSYCH: Patient is awake, alert, oriented x3.   LABORATORY, DIAGNOSTIC AND RADIOLOGICAL DATA: CT of the head without contrast: No  evidence of acute ischemic or hemorrhagic infarction.   Chest x-ray: No acute changes identified. There is minimal  discoid atelectasis at the left base. Port-A-Cath is present.   Comprehensive metabolic panel within normal limits. Glucose 106. CBC within normal limits. Magnesium 1.5. PT-INR 12.6 and 0.9.   Troponin less than 0.02.   ASSESSMENT: 69 year old Ms. Kellie Simmering with:  1. Presented with left-sided weakness upper and lower extremity with expressive aphasia that resolved suspicious for transient ischemic attack.  2. Hypertension.  3. History of breast cancer status post surgery, radiation and chemotherapy.  4. Borderline type 2 diabetes.  5. Borderline hyperlipidemia.   PLAN:  1. Admit patient to observation floor.  2. IV fluids 1 liter then stop.  3. Will continue full dose aspirin, atenolol. Hold off on lisinopril hydrochlorothiazide since we will consider allowing some permissive hypertension in the setting of symptoms of transient ischemic attack.  4. Continue letrozole daily.  5. Replace magnesium.  6. Will check a carotid Doppler, echo and MRI of the brain.  7. Physical therapy to see patient.  8. Further work-up according to patient's clinical course. Hospital admission plan was discussed with the patient and her sister who was present in the Emergency Room.   TIME SPENT: 45 minutes.   ____________________________ Hart Rochester Posey Pronto, MD sap:cms D: 12/13/2011 15:20:52 ET T: 12/13/2011 16:00:56 ET  JOB#: [id number] cc: [place] MD ELECTRONICALLY SIGNED 12/14/2011 15:32

## 2015-01-23 NOTE — Discharge Summary (Signed)
PATIENT NAME:  Michelle Manning, BIGGERS MR#:  [id number] DATE OF BIRTH:  20-Jan-1946  DATE OF ADMISSION:  12/13/2011 DATE OF DISCHARGE:  12/14/2011  PRESENTING COMPLAINT: Expressive aphasia with left-sided weakness.   DISCHARGE DIAGNOSES:  1. Transient ischemic attack, resolved.  2. Hypertension.  3. Hyperlipidemia.   CONDITION ON DISCHARGE: Fair. Vitals stable.   MEDICATIONS:  1. Letrozole 2.5 mg p.o. daily.  2. Atenolol 25 mg daily.  3. Hydrochlorothiazide/lisinopril 25/20 one p.o. daily.  4. Multivitamin p.o. daily.  5. Aspirin 325 mg p.o. daily.  6. Pravachol 20 mg at bedtime.   DIET: Low fat regular diet.   FOLLOW-UP: Follow-up with [facility] in 1 to 2 weeks.   LABORATORY, DIAGNOSTIC, AND RADIOLOGICAL DATA: Cardiac enzymes x3 negative. Comprehensive metabolic panel within normal limits. CBC within normal limits. Magnesium 1.5. PT-INR 12.6 and 0.9. Cholesterol 239, LDL 173, HDL 52.   Chest x-ray no acute cardiopulmonary abnormality. CT of the head no acute intracranial abnormality. Ultrasound Doppler carotid bilateral negative for stenosis. MRI of the brain sequelae of chronic small vessel ischemia. No acute intracranial hemorrhage or infarction.   BRIEF SUMMARY OF HOSPITAL COURSE: [name] is a pleasant 69 year old African American female who came into the Emergency Room with:  1. Transient ischemic attack. She presented with symptoms of expressive aphasia and left upper and lower extremity weakness. Her symptoms resolved completely in a couple of hours after she received aspirin in the Emergency Room. Her CT head and MRI of the brain were negative. Carotid Doppler showed no stenosis. She did very well with physical therapy. She was recommended to continue full dose aspirin for now.  2. Hypertension. Home meds were resumed prior to discharge. Some permissive hypertension was allowed.  3. Breast cancer, on Letrozole.  4. Borderline hyperlipidemia. Given symptoms of hyperlipidemia,  she was started on Pravachol. Diet info was provided.   Hospital stay otherwise remained stable.   CODE STATUS: The patient remained a FULL CODE.   TIME SPENT: 40 minutes.   ____________________________ Hart Rochester Posey Pronto, MD sap:drc D: 12/14/2011 14:50:16 ET T: 12/14/2011 15:08:34 ET JOB#: [id number]  cc: Adithi Gammon A. Posey Pronto, MD, <Dictator> [place] MD ELECTRONICALLY SIGNED 12/14/2011 15:32

## 2015-01-25 ENCOUNTER — Ambulatory Visit: Payer: Self-pay

## 2015-01-28 ENCOUNTER — Ambulatory Visit
Admit: 2015-01-28 | Disposition: A | Discharge status: Home / Self Care | Payer: Self-pay | Attending: Anesthesiology | Admitting: Anesthesiology

## 2015-01-28 LAB — POTASSIUM: Potassium: 3.6 mmol/L

## 2015-02-04 ENCOUNTER — Ambulatory Visit: Payer: Medicare HMO | Admitting: Anesthesiology

## 2015-02-04 ENCOUNTER — Encounter
Admission: RE | Disposition: A | Discharge status: Home / Self Care | Payer: Self-pay | Source: Ambulatory Visit | Attending: Surgery

## 2015-02-04 ENCOUNTER — Ambulatory Visit
Admission: RE | Admit: 2015-02-04 | Discharge: 2015-02-04 | Disposition: A | Discharge status: Home / Self Care | Payer: Medicare HMO | Source: Ambulatory Visit | Attending: Surgery | Admitting: Surgery

## 2015-02-04 ENCOUNTER — Encounter: Payer: Self-pay | Admitting: *Deleted

## 2015-02-04 DIAGNOSIS — Z853 Personal history of malignant neoplasm of breast: Secondary | ICD-10-CM | POA: Diagnosis not present

## 2015-02-04 DIAGNOSIS — Z452 Encounter for adjustment and management of vascular access device: Secondary | ICD-10-CM | POA: Diagnosis present

## 2015-02-04 DIAGNOSIS — I10 Essential (primary) hypertension: Secondary | ICD-10-CM | POA: Diagnosis not present

## 2015-02-04 DIAGNOSIS — Z9049 Acquired absence of other specified parts of digestive tract: Secondary | ICD-10-CM | POA: Diagnosis not present

## 2015-02-04 DIAGNOSIS — Z90710 Acquired absence of both cervix and uterus: Secondary | ICD-10-CM | POA: Insufficient documentation

## 2015-02-04 DIAGNOSIS — Z8673 Personal history of transient ischemic attack (TIA), and cerebral infarction without residual deficits: Secondary | ICD-10-CM | POA: Insufficient documentation

## 2015-02-04 HISTORY — PX: PORT-A-CATH REMOVAL: SHX5289

## 2015-02-04 SURGERY — REMOVAL PORT-A-CATH
Anesthesia: Monitor Anesthesia Care | Laterality: Left | Wound class: Clean

## 2015-02-04 MED ORDER — LIDOCAINE HCL 1 % IJ SOLN
INTRAMUSCULAR | Status: DC | PRN
  Administered 2015-02-04: 23 mL

## 2015-02-04 MED ORDER — ONDANSETRON HCL 4 MG/2ML IJ SOLN: 4.0000 mg | Freq: Once | INTRAMUSCULAR | Status: DC | PRN

## 2015-02-04 MED ORDER — LIDOCAINE HCL (PF) 1 % IJ SOLN
INTRAMUSCULAR | Status: AC
  Filled 2015-02-04: qty 30

## 2015-02-04 MED ORDER — FENTANYL CITRATE (PF) 100 MCG/2ML IJ SOLN
25.0000 ug | INTRAMUSCULAR | Status: DC | PRN
  Administered 2015-02-04: 25 ug via INTRAVENOUS

## 2015-02-04 MED ORDER — LIDOCAINE HCL (CARDIAC) 20 MG/ML IV SOLN
INTRAVENOUS | Status: DC | PRN
  Administered 2015-02-04: 100 mg via INTRAVENOUS

## 2015-02-04 MED ORDER — FENTANYL CITRATE (PF) 100 MCG/2ML IJ SOLN
INTRAMUSCULAR | Status: AC
  Filled 2015-02-04: qty 2

## 2015-02-04 MED ORDER — HYDROCODONE-ACETAMINOPHEN 5-325 MG PO TABS: 1.0000 | ORAL_TABLET | Freq: Four times a day (QID) | ORAL | Status: DC | PRN

## 2015-02-04 MED ORDER — SODIUM BICARBONATE 4 % IV SOLN
INTRAVENOUS | Status: AC
  Filled 2015-02-04: qty 5

## 2015-02-04 MED ORDER — MIDAZOLAM HCL 2 MG/2ML IJ SOLN
INTRAMUSCULAR | Status: DC | PRN
  Administered 2015-02-04: 2 mg via INTRAVENOUS

## 2015-02-04 MED ORDER — PROPOFOL 500 MG/50ML IV EMUL
INTRAVENOUS | Status: DC | PRN
  Administered 2015-02-04: 3 mL via INTRAVENOUS

## 2015-02-04 MED ORDER — LACTATED RINGERS IV SOLN
INTRAVENOUS | Status: DC
  Administered 2015-02-04 (×2): via INTRAVENOUS

## 2015-02-04 SURGICAL SUPPLY — 23 items
CANISTER SUCT 1200ML W/VALVE (MISCELLANEOUS) ×2 IMPLANT
CHLORAPREP W/TINT 26ML (MISCELLANEOUS) ×2 IMPLANT
COVER LIGHT HANDLE STERIS (MISCELLANEOUS) ×4 IMPLANT
DRESSING TELFA 4X3 1S ST N-ADH (GAUZE/BANDAGES/DRESSINGS) ×2 IMPLANT
DRSG TEGADERM 4X4.75 (GAUZE/BANDAGES/DRESSINGS) ×2 IMPLANT
DRSG TELFA 3X8 NADH (GAUZE/BANDAGES/DRESSINGS) ×2 IMPLANT
ELECT CAUTERY NEEDLE TIP 1.0 (MISCELLANEOUS) ×2
ELECTRODE CAUTERY NEDL TIP 1.0 (MISCELLANEOUS) ×1 IMPLANT
GLOVE BIO SURGEON STRL SZ7.5 (GLOVE) ×2 IMPLANT
GLOVE INDICATOR 8.0 STRL GRN (GLOVE) ×2 IMPLANT
GOWN STRL REUS W/ TWL LRG LVL3 (GOWN DISPOSABLE) ×2 IMPLANT
GOWN STRL REUS W/TWL LRG LVL3 (GOWN DISPOSABLE) ×2
LABEL OR SOLS (LABEL) ×2 IMPLANT
PACK PORT-A-CATH (MISCELLANEOUS) ×2 IMPLANT
PAD GROUND ADULT SPLIT (MISCELLANEOUS) ×2 IMPLANT
STRAP SAFETY BODY (MISCELLANEOUS) ×2 IMPLANT
STRIP CLOSURE SKIN 1/4X4 (GAUZE/BANDAGES/DRESSINGS) ×2 IMPLANT
SUT ETHILON 4-0 (SUTURE) ×1
SUT ETHILON 4-0 FS2 18XMFL BLK (SUTURE) ×1
SUT VIC AB 3-0 SH 27 (SUTURE) ×1
SUT VIC AB 3-0 SH 27X BRD (SUTURE) ×1 IMPLANT
SUT VIC AB 4-0 FS2 27 (SUTURE) ×2 IMPLANT
SUTURE ETHLN 4-0 FS2 18XMF BLK (SUTURE) ×1 IMPLANT

## 2015-02-04 NOTE — Anesthesia Preprocedure Evaluation (Signed)
Anesthesia Evaluation  Patient identified by MRN, date of birth, ID band Patient awake    Reviewed: Allergy & Precautions, NPO status , Patient's Chart, lab work & pertinent test results, reviewed documented beta blocker date and time   Airway Mallampati: II  TM Distance: >3 FB Neck ROM: Full    Dental  (+) Upper Dentures, Lower Dentures   Pulmonary          Cardiovascular hypertension,     Neuro/Psych    GI/Hepatic   Endo/Other    Renal/GU      Musculoskeletal   Abdominal   Peds  Hematology   Anesthesia Other Findings   Reproductive/Obstetrics                             Anesthesia Physical Anesthesia Plan  ASA: III  Anesthesia Plan: MAC   Post-op Pain Management:    Induction: Intravenous  Airway Management Planned: Nasal Cannula  Additional Equipment:   Intra-op Plan:   Post-operative Plan:   Informed Consent: I have reviewed the patients History and Physical, chart, labs and discussed the procedure including the risks, benefits and alternatives for the proposed anesthesia with the patient or authorized representative who has indicated his/her understanding and acceptance.     Plan Discussed with: CRNA and Surgeon  Anesthesia Plan Comments:         Anesthesia Quick Evaluation

## 2015-02-04 NOTE — Anesthesia Postprocedure Evaluation (Signed)
  Anesthesia Post-op Note  Patient: Michelle Manning  Procedure(s) Performed: Procedure(s): REMOVAL PORT-A-CATH (Left)  Anesthesia type:MAC  Patient location: PACU  Post pain: Pain level controlled  Post assessment: Post-op Vital signs reviewed, Patient's Cardiovascular Status Stable, Respiratory Function Stable, Patent Airway and No signs of Nausea or vomiting  Post vital signs: Reviewed and stable  Last Vitals:  Filed Vitals:   02/04/15 1039  BP: 140/80  Pulse: 63  Temp: 36.8 C  Resp: 15    Level of consciousness: awake, alert  and patient cooperative  Complications: No apparent anesthesia complications

## 2015-02-04 NOTE — Transfer of Care (Signed)
Immediate Anesthesia Transfer of Care Note  Patient: Michelle Manning  Procedure(s) Performed: Procedure(s): REMOVAL PORT-A-CATH (Left)  Patient Location: PACU  Anesthesia Type:MAC  Level of Consciousness: awake, alert , oriented and patient cooperative  Airway & Oxygen Therapy: Patient Spontanous Breathing and Patient connected to face mask oxygen  Post-op Assessment: Report given to RN, Post -op Vital signs reviewed and stable and Patient moving all extremities X 4  Post vital signs: Reviewed and stable  Last Vitals:  Filed Vitals:   02/04/15 1008  BP: 141/75  Pulse: 65  Temp: 36.3 C  Resp: 19    Complications: No apparent anesthesia complications

## 2015-02-04 NOTE — Op Note (Signed)
02/04/2015  10:01 AM  PATIENT:  Michelle Manning  69 y.o. female  PRE-OPERATIVE DIAGNOSIS:  NO LONGER NEEDED  POST-OPERATIVE DIAGNOSIS:  NO LONGER NEEDED  PROCEDURE:  Procedure(s): REMOVAL PORT-A-CATH (Left)  SURGEON:  Surgeon(s) and Role:    * III Dia Crawford, MD - Primary  PHYSICIAN ASSISTANT:   ASSISTANTS: none   ANESTHESIA:   IV sedation  EBL:     BLOOD ADMINISTERED:none  DRAINS: none   LOCAL MEDICATIONS USED:  XYLOCAINE   SPECIMEN:  No Specimen  DISPOSITION OF SPECIMEN:  N/A  COUNTS:  YES  TOURNIQUET:  * No tourniquets in log *  DICTATION: .Dragon Dictation with the patient supine position and after induction of appropriate IV sedation, the patient's left chest was prepped with ChloraPrep and draped sterile towels. 1% Xylocaine buffered with sodium bicarbonate was injected over the palpable Port-A-Cath device. The area was incised and the incision carried down through the subcutaneous space tissue using the Bovie electrocautery. The device was identified and the capsule incised over the device and the device removed without difficulty. The Port-A-Cath and the catheter appeared to be intact without evidence of infection. The specimen was photographed. The capsule was oversewn using interrupted sutures of 3-0 Vicryl, the subcutaneous space was obliterated with 3-0 Vicryl and the skin was closed with 4-0 nylon in a vertical mattress fashion. Sterile dressings were applied and the patient returned to the recovery room in satisfactory condition.  PLAN OF CARE: Discharge to home after PACU  PATIENT DISPOSITION:  PACU - hemodynamically stable.   Delay start of Pharmacological VTE agent (>24hrs) due to surgical blood loss or risk of bleeding: not applicable   ELY III, Waneda Klammer, MD

## 2015-02-04 NOTE — H&P (Signed)
Michelle Manning is a 69 y.o. female who presents today for port removal  HPI: She has completed her course of chemotherapy and is prepared to have port removed  Past Medical History  Diagnosis Date  . Cancer     breast  . Hypertension   . Stroke     tia few years ago   Past Surgical History  Procedure Laterality Date  . Portacath placement    . Tonsillectomy    . Cholecystectomy    . Abdominal hysterectomy    . Breast surgery      times 2   History   Social History  . Marital Status: Divorced    Spouse Name: N/A  . Number of Children: N/A  . Years of Education: N/A   Social History Main Topics  . Smoking status: Never Smoker   . Smokeless tobacco: Never Used  . Alcohol Use: No  . Drug Use: No  . Sexual Activity: No   Other Topics Concern  . None   Social History Narrative    Review of Systems: See HPI, otherwise negative ROS  PHYSICAL EXAM: BP 159/69 mmHg  Pulse 64  Temp(Src)  (Oral)  Resp 16  Ht 5\' 4"  (1.626 m)  Wt 99.791 kg (220 lb)  BMI 37.74 kg/m2  SpO2 100%  General:   Alert,  pleasant and cooperative in NAD Head:  Normocephalic and atraumatic. Neck:  Supple; no masses or thyromegaly. Lungs:  Clear throughout to auscultation.    Heart:  Regular rate and rhythm. Abdomen:  Soft, nontender and nondistended. Normal bowel sounds, without guarding, and without rebound.   Neurologic:  Alert and  oriented x4;  grossly normal neurologically. Port is in left chest  Impression/Plan: [name] is here for a port removal.2 Risks, benefits, limitations, and alternatives regarding  Port removal have been reviewed with the patient.  Questions have been answered.  All parties agreeable.   Jeanie Cooks, MD  02/04/2015, 9:26 AM

## 2015-02-04 NOTE — H&P (Signed)
H and P has been scanned in to date and is unchanged pre op.  Questions discussed and answered.

## 2015-02-08 ENCOUNTER — Encounter: Payer: Self-pay | Admitting: Surgery

## 2015-04-22 ENCOUNTER — Ambulatory Visit: Payer: Medicare HMO | Attending: Oncology

## 2015-04-22 ENCOUNTER — Other Ambulatory Visit: Payer: Self-pay

## 2015-06-16 ENCOUNTER — Other Ambulatory Visit: Payer: Self-pay | Admitting: *Deleted

## 2015-06-16 DIAGNOSIS — C50919 Malignant neoplasm of unspecified site of unspecified female breast: Secondary | ICD-10-CM

## 2015-06-17 ENCOUNTER — Other Ambulatory Visit: Payer: Self-pay

## 2015-06-17 ENCOUNTER — Ambulatory Visit: Payer: Self-pay | Admitting: Oncology

## 2015-07-01 ENCOUNTER — Other Ambulatory Visit: Payer: Self-pay

## 2015-07-01 ENCOUNTER — Ambulatory Visit: Payer: Self-pay | Admitting: Oncology

## 2015-07-08 ENCOUNTER — Ambulatory Visit: Payer: Self-pay | Admitting: Oncology

## 2015-07-08 ENCOUNTER — Other Ambulatory Visit: Payer: Self-pay

## 2015-07-22 ENCOUNTER — Inpatient Hospital Stay: Payer: Medicare HMO

## 2015-07-22 ENCOUNTER — Inpatient Hospital Stay: Payer: Medicare HMO | Attending: Oncology

## 2015-07-22 ENCOUNTER — Encounter: Payer: Self-pay | Admitting: Oncology

## 2015-07-22 ENCOUNTER — Inpatient Hospital Stay (HOSPITAL_BASED_OUTPATIENT_CLINIC_OR_DEPARTMENT_OTHER): Payer: Medicare HMO | Admitting: Oncology

## 2015-07-22 DIAGNOSIS — I10 Essential (primary) hypertension: Secondary | ICD-10-CM | POA: Insufficient documentation

## 2015-07-22 DIAGNOSIS — Z8673 Personal history of transient ischemic attack (TIA), and cerebral infarction without residual deficits: Secondary | ICD-10-CM | POA: Insufficient documentation

## 2015-07-22 DIAGNOSIS — Z853 Personal history of malignant neoplasm of breast: Secondary | ICD-10-CM | POA: Diagnosis present

## 2015-07-22 DIAGNOSIS — Z79899 Other long term (current) drug therapy: Secondary | ICD-10-CM | POA: Insufficient documentation

## 2015-07-22 DIAGNOSIS — Z170 Estrogen receptor positive status [ER+]: Secondary | ICD-10-CM | POA: Insufficient documentation

## 2015-07-22 DIAGNOSIS — Z9223 Personal history of estrogen therapy: Secondary | ICD-10-CM

## 2015-07-22 DIAGNOSIS — C50919 Malignant neoplasm of unspecified site of unspecified female breast: Secondary | ICD-10-CM

## 2015-07-22 DIAGNOSIS — Z7982 Long term (current) use of aspirin: Secondary | ICD-10-CM | POA: Insufficient documentation

## 2015-07-22 DIAGNOSIS — C50911 Malignant neoplasm of unspecified site of right female breast: Secondary | ICD-10-CM

## 2015-07-22 NOTE — Progress Notes (Signed)
Patient offers no problems or concerns today.

## 2015-07-23 LAB — CANCER ANTIGEN 27.29: CA 27.29: 12.7 U/mL (ref 0.0–38.6)

## 2015-08-05 NOTE — Progress Notes (Signed)
[name]  [phone] [phone]) [phone]  ID: Michelle Manning OB: [birth year]  MR#: [id number]  HWT#:[id number]  Patient Care Team: Everett Graff, MD as PCP - General (Obstetrics and Gynecology)  CHIEF COMPLAINT:  Chief Complaint  Patient presents with  . Breast Cancer    INTERVAL HISTORY: Patient returns to clinic today for routine follow-up. She was last evaluated in clinic in February 2016. She recently completed her 5 years of that result in September 2016. She currently feels well and is asymptomatic. She continues to remain very active.  She does not complain of weakness and fatigue. She denies any fevers or chills. She denies any nausea or vomiting.  She denies any cough or chest pain.  She has good appetite and has maintained her weight.  Patient offers no specific complaints today.   REVIEW OF SYSTEMS:   Review of Systems  Constitutional: Negative.  Negative for fever and malaise/fatigue.  Respiratory: Negative.   Cardiovascular: Negative.   Gastrointestinal: Negative.   Musculoskeletal: Negative.   Neurological: Negative.  Negative for sensory change and weakness.    As per HPI. Otherwise, a complete review of systems is negatve.  PAST MEDICAL HISTORY: Past Medical History  Diagnosis Date  . Cancer (HCC)     breast  . Hypertension   . Stroke ([place])     tia few years ago    PAST SURGICAL HISTORY: Past Surgical History  Procedure Laterality Date  . Portacath placement    . Tonsillectomy    . Cholecystectomy    . Abdominal hysterectomy    . Breast surgery      times 2  . Port-a-cath removal Left 02/04/2015    Procedure: REMOVAL PORT-A-CATH;  Surgeon: III Dia Crawford, MD;  Location: [facility] ORS;  Service: General;  Laterality: Left;    FAMILY HISTORY History reviewed. No pertinent family history.     ADVANCED DIRECTIVES:    HEALTH MAINTENANCE: Social History  Substance Use Topics  . Smoking status: Never Smoker   .  Smokeless tobacco: Never Used  . Alcohol Use: No     Colonoscopy:  PAP:  Bone density:  Lipid panel:  Allergies  Allergen Reactions  . Peanuts [Peanut Oil] Hives    Current Outpatient Prescriptions  Medication Sig Dispense Refill  . ALPRAZolam (NIRAVAM) 0.25 MG dissolvable tablet Take 0.25 mg by mouth 2 (two) times daily as needed for anxiety.    Marland Kitchen aspirin 325 MG tablet Take 325 mg by mouth daily.    Marland Kitchen atenolol (TENORMIN) 25 MG tablet Take by mouth daily.    Marland Kitchen HYDROcodone-acetaminophen (NORCO/VICODIN) 5-325 MG per tablet Take 1 tablet by mouth every 6 (six) hours as needed for moderate pain. 30 tablet 0  . ibuprofen (ADVIL,MOTRIN) 200 MG tablet Take 600 mg by mouth every 8 (eight) hours as needed.    Marland Kitchen letrozole (FEMARA) 2.5 MG tablet Take 2.5 mg by mouth daily.    Marland Kitchen lisinopril-hydrochlorothiazide (PRINZIDE,ZESTORETIC) 20-25 MG per tablet Take 1 tablet by mouth daily.    . Multiple Vitamin (MULTIVITAMIN) capsule Take 1 capsule by mouth daily.    Marland Kitchen omeprazole (PRILOSEC) 20 MG capsule Take 20 mg by mouth daily.    . pravastatin (PRAVACHOL) 20 MG tablet Take 20 mg by mouth daily.    . sertraline (ZOLOFT) 25 MG tablet Take 25 mg by mouth daily.     No current facility-administered medications for this visit.    OBJECTIVE: There were no vitals filed for this visit.  There is no weight on file to calculate BMI.    ECOG FS:0 - Asymptomatic  General: Well-developed, well-nourished, no acute distress. Eyes: Pink conjunctiva, anicteric sclera. Breasts: Patient requested exam be deferred today. Lungs: Clear to auscultation bilaterally. Heart: Regular rate and rhythm. No rubs, murmurs, or gallops. Abdomen: Soft, nontender, nondistended. No organomegaly noted, normoactive bowel sounds. Musculoskeletal: No edema, cyanosis, or clubbing. Neuro: Alert, answering all questions appropriately. Cranial nerves grossly intact. Skin: No rashes or petechiae noted. Psych: Normal affect.   LAB  RESULTS:  Lab Results  Component Value Date   NA 140 07/07/2014   K 3.6 01/28/2015   CL 102 07/07/2014   CO2 32 07/07/2014   GLUCOSE 116* 07/07/2014   BUN 14 07/07/2014   CREATININE 0.86 07/07/2014   CALCIUM 9.0 07/07/2014   PROT 7.2 07/07/2014   ALBUMIN 3.5 07/07/2014   AST 18 07/07/2014   ALT 24 07/07/2014   ALKPHOS 54 07/07/2014   BILITOT 0.4 07/07/2014   GFRNONAA >60 07/07/2014   GFRAA >60 07/07/2014    Lab Results  Component Value Date   WBC 7.4 07/07/2014   NEUTROABS 5.3 07/07/2014   HGB 11.9* 07/07/2014   HCT 36.3 07/07/2014   MCV 86 07/07/2014   PLT 253 07/07/2014     STUDIES: No results found.  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  PLAN:    1.  Breast cancer: No evidence of disease.  Patient has completed 5 year of letrozole in September 2016. Patient has not had a mammogram since April 19, 2014 which was reported as BI-RADS 2.  Bone mineral density on Feb 10, 2014 was reported as normal. Patient will require mammogram in the near future. Return to clinic in 1 year with repeat laboratory work and further evaluation.  Patient expressed understanding and was in agreement with this plan. She also understands that She can call clinic at any time with any questions, concerns, or complaints.    Lloyd Huger, MD   08/05/2015 11:57 AM

## 2015-10-07 ENCOUNTER — Other Ambulatory Visit: Payer: Self-pay | Admitting: Oncology

## 2015-10-07 ENCOUNTER — Ambulatory Visit
Admission: RE | Admit: 2015-10-07 | Discharge: 2015-10-07 | Disposition: A | Discharge status: Home / Self Care | Payer: Medicare HMO | Source: Ambulatory Visit | Attending: Oncology | Admitting: Oncology

## 2015-10-07 ENCOUNTER — Ambulatory Visit: Admission: RE | Admit: 2015-10-07 | Payer: Medicare HMO | Source: Ambulatory Visit

## 2015-10-07 DIAGNOSIS — Z853 Personal history of malignant neoplasm of breast: Secondary | ICD-10-CM | POA: Insufficient documentation

## 2015-10-07 DIAGNOSIS — C50911 Malignant neoplasm of unspecified site of right female breast: Secondary | ICD-10-CM

## 2016-03-12 ENCOUNTER — Emergency Department
Admission: EM | Admit: 2016-03-12 | Discharge: 2016-03-12 | Disposition: A | Discharge status: Home / Self Care | Payer: Medicare HMO | Attending: Emergency Medicine | Admitting: Emergency Medicine

## 2016-03-12 DIAGNOSIS — R21 Rash and other nonspecific skin eruption: Secondary | ICD-10-CM

## 2016-03-12 DIAGNOSIS — Z8673 Personal history of transient ischemic attack (TIA), and cerebral infarction without residual deficits: Secondary | ICD-10-CM | POA: Insufficient documentation

## 2016-03-12 DIAGNOSIS — T7840XA Allergy, unspecified, initial encounter: Secondary | ICD-10-CM | POA: Diagnosis present

## 2016-03-12 DIAGNOSIS — I10 Essential (primary) hypertension: Secondary | ICD-10-CM | POA: Insufficient documentation

## 2016-03-12 DIAGNOSIS — L299 Pruritus, unspecified: Secondary | ICD-10-CM

## 2016-03-12 DIAGNOSIS — Z853 Personal history of malignant neoplasm of breast: Secondary | ICD-10-CM | POA: Insufficient documentation

## 2016-03-12 DIAGNOSIS — L509 Urticaria, unspecified: Secondary | ICD-10-CM

## 2016-03-12 DIAGNOSIS — Z79899 Other long term (current) drug therapy: Secondary | ICD-10-CM | POA: Insufficient documentation

## 2016-03-12 DIAGNOSIS — Z7982 Long term (current) use of aspirin: Secondary | ICD-10-CM | POA: Diagnosis not present

## 2016-03-12 MED ORDER — HYDROXYZINE PAMOATE 25 MG PO CAPS: 25.0000 mg | ORAL_CAPSULE | Freq: Three times a day (TID) | ORAL | Status: DC | PRN

## 2016-03-12 MED ORDER — HYDROXYZINE HCL 25 MG PO TABS
25.0000 mg | ORAL_TABLET | Freq: Once | ORAL | Status: AC
  Administered 2016-03-12: 25 mg via ORAL
  Filled 2016-03-12: qty 1

## 2016-03-12 MED ORDER — PREDNISONE 20 MG PO TABS: 60.0000 mg | ORAL_TABLET | Freq: Every day | ORAL | Status: DC

## 2016-03-12 MED ORDER — PREDNISONE 20 MG PO TABS
60.0000 mg | ORAL_TABLET | Freq: Once | ORAL | Status: AC
  Administered 2016-03-12: 60 mg via ORAL
  Filled 2016-03-12: qty 3

## 2016-03-12 MED ORDER — FAMOTIDINE 20 MG PO TABS
20.0000 mg | ORAL_TABLET | Freq: Once | ORAL | Status: AC
  Administered 2016-03-12: 20 mg via ORAL
  Filled 2016-03-12: qty 1
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
## 2016-03-12 NOTE — ED Notes (Signed)
Patient reports symptoms for approximately 2 hours.  Reports woke due to itching and noticed areas on both sides of neck at clavicle areas that are red, itchy and slightly swollen.  Reports took 2 benadryl prior to arrival at ED.

## 2016-03-12 NOTE — Discharge Instructions (Signed)
Allergies An allergy is when your body reacts to a substance in a way that is not normal. An allergic reaction can happen after you:  Eat something.  Breathe in something.  Touch something. WHAT KINDS OF ALLERGIES ARE THERE? You can be allergic to:  Things that are only around during certain seasons, like molds and pollens.  Foods.  Drugs.  Insects.  Animal dander. WHAT ARE SYMPTOMS OF ALLERGIES?  Puffiness (swelling). This may happen on the lips, face, tongue, mouth, or throat.  Sneezing.  Coughing.  Breathing loudly (wheezing).  Stuffy nose.  Tingling in the mouth.  A rash.  Itching.  Itchy, red, puffy areas of skin (hives).  Watery eyes.  Throwing up (vomiting).  Watery poop (diarrhea).  Dizziness.  Feeling faint or fainting.  Trouble breathing or swallowing.  A tight feeling in the chest.  A fast heartbeat. HOW ARE ALLERGIES DIAGNOSED? Allergies can be diagnosed with:  A medical and family history.  Skin tests.  Blood tests.  A food diary. A food diary is a record of all the foods, drinks, and symptoms you have each day.  The results of an elimination diet. This diet involves making sure not to eat certain foods and then seeing what happens when you start eating them again. HOW ARE ALLERGIES TREATED? There is no cure for allergies, but allergic reactions can be treated with medicine. Severe reactions usually need to be treated at a hospital.  HOW CAN REACTIONS BE PREVENTED? The best way to prevent an allergic reaction is to avoid the thing you are allergic to. Allergy shots and medicines can also help prevent reactions in some cases.   This information is not intended to replace advice given to you by your health care provider. Make sure you discuss any questions you have with your health care provider.   Document Released: 01/12/2013 Document Revised: 10/08/2014 Document Reviewed: 06/29/2014 Elsevier Interactive Patient Education 2016  [place] are itchy, red, swollen areas of the skin. They can vary in size and location on your body. Hives can come and go for hours or several days (acute hives) or for several weeks (chronic hives). Hives do not spread from person to person (noncontagious). They may get worse with scratching, exercise, and emotional stress. CAUSES   Allergic reaction to food, additives, or drugs.  Infections, including the common cold.  Illness, such as vasculitis, lupus, or thyroid disease.  Exposure to sunlight, heat, or cold.  Exercise.  Stress.  Contact with chemicals. SYMPTOMS   Red or white swollen patches on the skin. The patches may change size, shape, and location quickly and repeatedly.  Itching.  Swelling of the hands, feet, and face. This may occur if hives develop deeper in the skin. DIAGNOSIS  Your caregiver can usually tell what is wrong by performing a physical exam. Skin or blood tests may also be done to determine the cause of your hives. In some cases, the cause cannot be determined. TREATMENT  Mild cases usually get better with medicines such as antihistamines. Severe cases may require an emergency epinephrine injection. If the cause of your hives is known, treatment includes avoiding that trigger.  HOME CARE INSTRUCTIONS   Avoid causes that trigger your hives.  Take antihistamines as directed by your caregiver to reduce the severity of your hives. Non-sedating or low-sedating antihistamines are usually recommended. Do not drive while taking an antihistamine.  Take any other medicines prescribed for itching as directed by your caregiver.  Wear  loose-fitting clothing.  Keep all follow-up appointments as directed by your caregiver. SEEK MEDICAL CARE IF:   You have persistent or severe itching that is not relieved with medicine.  You have painful or swollen joints. SEEK IMMEDIATE MEDICAL CARE IF:   You have a fever.  Your tongue or lips are  swollen.  You have trouble breathing or swallowing.  You feel tightness in the throat or chest.  You have abdominal pain. These problems may be the first sign of a life-threatening allergic reaction. Call your local emergency services (911 in [place] MAKE SURE YOU:   Understand these instructions.  Will watch your condition.  Will get help right away if you are not doing well or get worse.   This information is not intended to replace advice given to you by your health care provider. Make sure you discuss any questions you have with your health care provider.   Document Released: 09/17/2005 Document Revised: 09/22/2013 Document Reviewed: 12/11/2011 Elsevier Interactive Patient Education 2016 Elsevier Inc.  Pruritus Pruritus is an itching feeling. There are many different conditions and factors that can make your skin itchy. Dry skin is one of the most common causes of itching. Most cases of itching do not require medical attention. Itchy skin can turn into a rash.  HOME CARE INSTRUCTIONS  Watch your pruritus for any changes. Take these steps to help with your condition:  Skin Care  Moisturize your skin as needed. A moisturizer that contains petroleum jelly is best for keeping moisture in your skin.  Take or apply medicines only as directed by your health care provider. This may include:  Corticosteroid cream.  Anti-itch lotions.  Oral anti-histamines.  Apply cool compresses to the affected areas.  Try taking a bath with:  Epsom salts. Follow the instructions on the packaging. You can get these at your local pharmacy or grocery store.  Baking soda. Pour a small amount into the bath as directed by your health care provider.  Colloidal oatmeal. Follow the instructions on the packaging. You can get this at your local pharmacy or grocery store.  Try applying baking soda paste to your skin. Stir water into baking soda until it reaches a paste-like consistency.   Do not  scratch your skin.  Avoid hot showers or baths, which can make itching worse. A cold shower may help with itching as long as you use a moisturizer after.  Avoid scented soaps, detergents, and perfumes. Use gentle soaps, detergents, perfumes, and other cosmetic products. General Instructions  Avoid wearing tight clothes.  Keep a journal to help track what causes your itch. Write down:  What you eat.  What cosmetic products you use.  What you drink.  What you wear. This includes jewelry.  Use a humidifier. This keeps the air moist, which helps to prevent dry skin. SEEK MEDICAL CARE IF:  The itching does not go away after several days.  You sweat at night.  You have weight loss.  You are unusually thirsty.  You urinate more than normal.  You are more tired than normal.  You have abdominal pain.  Your skin tingles.  You feel weak.  Your skin or the whites of your eyes look yellow (jaundice).  Your skin feels numb.   This information is not intended to replace advice given to you by your health care provider. Make sure you discuss any questions you have with your health care provider.   Document Released: 05/30/2011 Document Revised: 02/01/2015 Document Reviewed: 09/13/2014 Elsevier  Interactive Patient Education ©2016 Elsevier Inc. ° °

## 2016-07-25 DIAGNOSIS — C50911 Malignant neoplasm of unspecified site of right female breast: Secondary | ICD-10-CM | POA: Insufficient documentation
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
## 2016-07-27 ENCOUNTER — Inpatient Hospital Stay: Payer: Medicare HMO | Attending: Oncology

## 2016-07-27 ENCOUNTER — Inpatient Hospital Stay (HOSPITAL_BASED_OUTPATIENT_CLINIC_OR_DEPARTMENT_OTHER): Payer: Medicare HMO | Admitting: Oncology

## 2016-07-27 VITALS — BP 184/103 | HR 73 | Temp 98.0°F | Resp 18 | Wt 228.4 lb

## 2016-07-27 DIAGNOSIS — Z853 Personal history of malignant neoplasm of breast: Secondary | ICD-10-CM | POA: Insufficient documentation

## 2016-07-27 DIAGNOSIS — Z79899 Other long term (current) drug therapy: Secondary | ICD-10-CM | POA: Diagnosis not present

## 2016-07-27 DIAGNOSIS — Z170 Estrogen receptor positive status [ER+]: Secondary | ICD-10-CM | POA: Insufficient documentation

## 2016-07-27 DIAGNOSIS — I10 Essential (primary) hypertension: Secondary | ICD-10-CM | POA: Diagnosis not present

## 2016-07-27 DIAGNOSIS — F430 Acute stress reaction: Secondary | ICD-10-CM

## 2016-07-27 DIAGNOSIS — Z7982 Long term (current) use of aspirin: Secondary | ICD-10-CM | POA: Diagnosis not present

## 2016-07-27 DIAGNOSIS — Z8673 Personal history of transient ischemic attack (TIA), and cerebral infarction without residual deficits: Secondary | ICD-10-CM | POA: Insufficient documentation

## 2016-07-27 DIAGNOSIS — Z9223 Personal history of estrogen therapy: Secondary | ICD-10-CM | POA: Insufficient documentation

## 2016-07-27 DIAGNOSIS — G4709 Other insomnia: Secondary | ICD-10-CM | POA: Insufficient documentation

## 2016-07-27 DIAGNOSIS — C50911 Malignant neoplasm of unspecified site of right female breast: Secondary | ICD-10-CM

## 2016-07-27 MED ORDER — ALPRAZOLAM 0.25 MG PO TABS: 0.2500 mg | ORAL_TABLET | Freq: Two times a day (BID) | ORAL | 0 refills | Status: DC | PRN
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
## 2016-07-28 LAB — CANCER ANTIGEN 27.29: CA 27.29: 15.4 U/mL (ref 0.0–38.6)

## 2016-08-02 DIAGNOSIS — F32A Depression, unspecified: Secondary | ICD-10-CM | POA: Insufficient documentation

## 2016-08-17 DIAGNOSIS — R7303 Prediabetes: Secondary | ICD-10-CM | POA: Insufficient documentation

## 2016-09-05 ENCOUNTER — Emergency Department
Admission: EM | Admit: 2016-09-05 | Discharge: 2016-09-05 | Disposition: A | Discharge status: Home / Self Care | Payer: Medicare HMO | Attending: Emergency Medicine | Admitting: Emergency Medicine

## 2016-09-05 ENCOUNTER — Emergency Department: Payer: Medicare HMO

## 2016-09-05 ENCOUNTER — Encounter: Payer: Self-pay | Admitting: *Deleted

## 2016-09-05 DIAGNOSIS — I10 Essential (primary) hypertension: Secondary | ICD-10-CM | POA: Diagnosis not present

## 2016-09-05 DIAGNOSIS — Z7982 Long term (current) use of aspirin: Secondary | ICD-10-CM | POA: Insufficient documentation

## 2016-09-05 DIAGNOSIS — Z853 Personal history of malignant neoplasm of breast: Secondary | ICD-10-CM | POA: Diagnosis not present

## 2016-09-05 DIAGNOSIS — Z79899 Other long term (current) drug therapy: Secondary | ICD-10-CM | POA: Diagnosis not present

## 2016-09-05 DIAGNOSIS — S6992XA Unspecified injury of left wrist, hand and finger(s), initial encounter: Secondary | ICD-10-CM | POA: Diagnosis present

## 2016-09-05 DIAGNOSIS — Y999 Unspecified external cause status: Secondary | ICD-10-CM | POA: Diagnosis not present

## 2016-09-05 DIAGNOSIS — Y9389 Activity, other specified: Secondary | ICD-10-CM | POA: Diagnosis not present

## 2016-09-05 DIAGNOSIS — S52592A Other fractures of lower end of left radius, initial encounter for closed fracture: Secondary | ICD-10-CM | POA: Diagnosis not present

## 2016-09-05 DIAGNOSIS — Y929 Unspecified place or not applicable: Secondary | ICD-10-CM | POA: Diagnosis not present

## 2016-09-05 DIAGNOSIS — S52615A Nondisplaced fracture of left ulna styloid process, initial encounter for closed fracture: Secondary | ICD-10-CM | POA: Insufficient documentation

## 2016-09-05 DIAGNOSIS — E785 Hyperlipidemia, unspecified: Secondary | ICD-10-CM | POA: Diagnosis not present

## 2016-09-05 DIAGNOSIS — S62102A Fracture of unspecified carpal bone, left wrist, initial encounter for closed fracture: Secondary | ICD-10-CM

## 2016-09-05 DIAGNOSIS — W07XXXA Fall from chair, initial encounter: Secondary | ICD-10-CM | POA: Insufficient documentation

## 2016-09-05 MED ORDER — OXYCODONE-ACETAMINOPHEN 5-325 MG PO TABS
1.0000 | ORAL_TABLET | Freq: Once | ORAL | Status: AC
  Administered 2016-09-05: 1 via ORAL
  Filled 2016-09-05: qty 1

## 2016-09-05 MED ORDER — OXYCODONE-ACETAMINOPHEN 5-325 MG PO TABS: 1.0000 | ORAL_TABLET | Freq: Four times a day (QID) | ORAL | 0 refills | Status: DC | PRN

## 2016-09-05 NOTE — Discharge Instructions (Signed)
Wear splint and sling until evaluation by orthopedics. Call the above number and the morning to schedule appointment.

## 2016-09-05 NOTE — ED Provider Notes (Signed)
[facility] Emergency Department Provider Note   ____________________________________________   First MD Initiated Contact with Patient 09/05/16 1538     (approximate)  I have reviewed the triage vital signs and the nursing notes.   HISTORY  Chief Complaint Wrist Pain    HPI Michelle Manning is a 70 y.o. female patient complaining of left wrist pain secondary to a fall. Patient states she was hanging curtains while standing on a chair. Patient stated chair slipped and she broke the fall to left wrist. Patient denies any other injury from this complaint.Patient rates the pain as 8/10. Patient described a pain as "achy". No palliative measures prior to arrival. Patient is right-hand dominant.   Past Medical History:  Diagnosis Date  . Cancer (HCC)    breast  . Hypertension   . Stroke [facility])    tia few years ago    Patient Active Problem List   Diagnosis Date Noted  . Primary cancer of right breast ([place]) 07/25/2016    Past Surgical History:  Procedure Laterality Date  . ABDOMINAL HYSTERECTOMY    . BREAST BIOPSY Right 2011   radation and chemo  . BREAST SURGERY     times 2  . CHOLECYSTECTOMY    . PORT-A-CATH REMOVAL Left 02/04/2015   Procedure: REMOVAL PORT-A-CATH;  Surgeon: III Dia Crawford, MD;  Location: [facility] ORS;  Service: General;  Laterality: Left;  . PORTACATH PLACEMENT    . TONSILLECTOMY      Prior to Admission medications   Medication Sig Start Date End Date Taking? Authorizing Provider  ALPRAZolam (XANAX) 0.25 MG tablet Take 1 tablet (0.25 mg total) by mouth 2 (two) times daily as needed for anxiety. 07/27/16   Lloyd Huger, MD  aspirin 325 MG tablet Take 325 mg by mouth daily.    Historical Provider, MD  atenolol (TENORMIN) 25 MG tablet Take by mouth daily.    Historical Provider, MD  lisinopril-hydrochlorothiazide (PRINZIDE,ZESTORETIC) 20-25 MG per tablet Take 1 tablet by mouth daily.    Historical Provider, MD  Multiple Vitamin  (MULTIVITAMIN) capsule Take 1 capsule by mouth daily.    Historical Provider, MD  oxyCODONE-acetaminophen (ROXICET) 5-325 MG tablet Take 1 tablet by mouth every 6 (six) hours as needed for moderate pain. 09/05/16   Sable Feil, PA-C    Allergies Peanuts [peanut oil]  No family history on file.  Social History Social History  Substance Use Topics  . Smoking status: Never Smoker  . Smokeless tobacco: Never Used  . Alcohol use No    Review of Systems Constitutional: No fever/chills Eyes: No visual changes. ENT: No sore throat. Cardiovascular: Denies chest pain. Respiratory: Denies shortness of breath. Gastrointestinal: No abdominal pain.  No nausea, no vomiting.  No diarrhea.  No constipation. Genitourinary: Negative for dysuria. Musculoskeletal: Left wrist pain Skin: Negative for rash. Neurological: Negative for headaches, focal weakness or numbness. Endocrine:Hypertension hyperlipidemia ____________________________________________   PHYSICAL EXAM:  VITAL SIGNS: ED Triage Vitals  Enc Vitals Group     BP 09/05/16 1505 (!) 161/77     Pulse Rate 09/05/16 1505 80     Resp 09/05/16 1505 20     Temp 09/05/16 1505 97.7 F (36.5 C)     Temp Source 09/05/16 1505 Oral     SpO2 09/05/16 1505 97 %     Weight 09/05/16 1501 205 lb (93 kg)     Height 09/05/16 1501 5\' 2"  (1.575 m)     Head Circumference --  Peak Flow --      Pain Score 09/05/16 1501 8     Pain Loc --      Pain Edu? --      Excl. in [place]? --     Constitutional: Alert and oriented. Well appearing and in no acute distress. Eyes: Conjunctivae are normal. PERRL. EOMI. Head: Atraumatic. Nose: No congestion/rhinnorhea. Mouth/Throat: Mucous membranes are moist.  Oropharynx non-erythematous. Neck: No stridor.  No cervical spine tenderness to palpation. Hematological/Lymphatic/Immunilogical: No cervical lymphadenopathy. Cardiovascular: Normal rate, regular rhythm. Grossly normal heart sounds.  Good peripheral  circulation. Respiratory: Normal respiratory effort.  No retractions. Lungs CTAB. Gastrointestinal: Soft and nontender. No distention. No abdominal bruits. No CVA tenderness. Musculoskeletal: No lower extremity tenderness nor edema.  No joint effusions. Neurologic:  Normal speech and language. No gross focal neurologic deficits are appreciated. No gait instability. Skin:  Skin is warm, dry and intact. No rash noted. Psychiatric: Mood and affect are normal. Speech and behavior are normal.  ____________________________________________   LABS (all labs ordered are listed, but only abnormal results are displayed)  Labs Reviewed - No data to display ____________________________________________  EKG   ____________________________________________  RADIOLOGY  X-ray revealed distal radial fracture and ulnar styloid fracture left wrist.  ____________________________________________   PROCEDURES  Procedure(s) performed: None  Procedures  Critical Care performed: No  ____________________________________________   INITIAL IMPRESSION / ASSESSMENT AND PLAN / ED COURSE  Pertinent labs & imaging results that were available during my care of the patient were reviewed by me and considered in my medical decision making (see chart for details).  Left wrist fracture. Patient given discharge care instructions. Patient advised follow orthopedics clinic in the morning for definitive evaluation and treatment.  Clinical Course   Discussed x-ray finding with patient. Patient placed in the OCL splint and sling prior to departure.   ____________________________________________   FINAL CLINICAL IMPRESSION(S) / ED DIAGNOSES  Final diagnoses:  Closed fracture of left wrist, initial encounter      NEW MEDICATIONS STARTED DURING THIS VISIT:  New Prescriptions   OXYCODONE-ACETAMINOPHEN (ROXICET) 5-325 MG TABLET    Take 1 tablet by mouth every 6 (six) hours as needed for moderate pain.      Note:  This document was prepared using Dragon voice recognition software and may include unintentional dictation errors.    Sable Feil, PA-C 09/05/16 [place], MD 09/05/16 2114

## 2016-09-05 NOTE — ED Triage Notes (Signed)
Pt tripped while hanging curtains, fell and landed on left wrist, pt denies LOC or any other injuries

## 2016-10-18 ENCOUNTER — Other Ambulatory Visit: Payer: Medicare HMO

## 2016-10-18 ENCOUNTER — Ambulatory Visit: Payer: Medicare HMO

## 2016-11-21 ENCOUNTER — Ambulatory Visit
Admission: RE | Admit: 2016-11-21 | Discharge: 2016-11-21 | Disposition: A | Discharge status: Home / Self Care | Payer: Medicare HMO | Source: Ambulatory Visit | Attending: Oncology | Admitting: Oncology

## 2016-11-21 DIAGNOSIS — C50911 Malignant neoplasm of unspecified site of right female breast: Secondary | ICD-10-CM

## 2016-11-21 DIAGNOSIS — Z853 Personal history of malignant neoplasm of breast: Secondary | ICD-10-CM | POA: Diagnosis present

## 2016-11-21 DIAGNOSIS — Z13820 Encounter for screening for osteoporosis: Secondary | ICD-10-CM | POA: Diagnosis present

## 2016-11-21 HISTORY — DX: Personal history of antineoplastic chemotherapy: Z92.21

## 2016-11-21 HISTORY — DX: Personal history of irradiation: Z92.3

## 2016-11-26 DIAGNOSIS — Z1211 Encounter for screening for malignant neoplasm of colon: Secondary | ICD-10-CM | POA: Insufficient documentation

## 2017-05-26 ENCOUNTER — Emergency Department
Admission: EM | Admit: 2017-05-26 | Discharge: 2017-05-26 | Disposition: A | Discharge status: Home / Self Care | Payer: Medicare HMO | Attending: Emergency Medicine | Admitting: Emergency Medicine

## 2017-05-26 ENCOUNTER — Emergency Department: Payer: Medicare HMO

## 2017-05-26 ENCOUNTER — Encounter: Payer: Self-pay | Admitting: Emergency Medicine

## 2017-05-26 DIAGNOSIS — Z91010 Allergy to peanuts: Secondary | ICD-10-CM | POA: Diagnosis not present

## 2017-05-26 DIAGNOSIS — R51 Headache: Secondary | ICD-10-CM | POA: Diagnosis present

## 2017-05-26 DIAGNOSIS — G8929 Other chronic pain: Secondary | ICD-10-CM

## 2017-05-26 DIAGNOSIS — Z7982 Long term (current) use of aspirin: Secondary | ICD-10-CM | POA: Insufficient documentation

## 2017-05-26 DIAGNOSIS — R519 Headache, unspecified: Secondary | ICD-10-CM

## 2017-05-26 DIAGNOSIS — Z79899 Other long term (current) drug therapy: Secondary | ICD-10-CM | POA: Diagnosis not present

## 2017-05-26 DIAGNOSIS — I10 Essential (primary) hypertension: Secondary | ICD-10-CM | POA: Diagnosis not present

## 2017-05-26 LAB — CBC WITH DIFFERENTIAL/PLATELET
BASOS ABS: 0.1 10*3/uL (ref 0–0.1)
Basophils Relative: 1 %
EOS ABS: 0.1 10*3/uL (ref 0–0.7)
EOS PCT: 2 %
HCT: 36 % (ref 35.0–47.0)
Hemoglobin: 12.3 g/dL (ref 12.0–16.0)
Lymphocytes Relative: 15 %
Lymphs Abs: 1.2 10*3/uL (ref 1.0–3.6)
MCH: 29.2 pg (ref 26.0–34.0)
MCHC: 34.2 g/dL (ref 32.0–36.0)
MCV: 85.5 fL (ref 80.0–100.0)
Monocytes Absolute: 0.6 10*3/uL (ref 0.2–0.9)
Monocytes Relative: 8 %
Neutro Abs: 6.1 10*3/uL (ref 1.4–6.5)
Neutrophils Relative %: 74 %
PLATELETS: 256 10*3/uL (ref 150–440)
RBC: 4.21 MIL/uL (ref 3.80–5.20)
RDW: 14.8 % — ABNORMAL HIGH (ref 11.5–14.5)
WBC: 8.2 10*3/uL (ref 3.6–11.0)

## 2017-05-26 LAB — BASIC METABOLIC PANEL
Anion gap: 8 (ref 5–15)
BUN: 12 mg/dL (ref 6–20)
CALCIUM: 9.5 mg/dL (ref 8.9–10.3)
CO2: 30 mmol/L (ref 22–32)
CREATININE: 0.74 mg/dL (ref 0.44–1.00)
Chloride: 103 mmol/L (ref 101–111)
GFR calc Af Amer: >60 mL/min (ref >60)
Glucose, Bld: 139 mg/dL — ABNORMAL HIGH (ref 65–99)
Potassium: 3.6 mmol/L (ref 3.5–5.1)
SODIUM: 141 mmol/L (ref 135–145)

## 2017-05-26 LAB — SEDIMENTATION RATE: SED RATE: 48 mm/h — AB (ref 0–30)

## 2017-05-26 MED ORDER — KETOROLAC TROMETHAMINE 30 MG/ML IJ SOLN
INTRAMUSCULAR | Status: AC
  Filled 2017-05-26: qty 1

## 2017-05-26 MED ORDER — SODIUM CHLORIDE 0.9 % IV BOLUS (SEPSIS)
500.0000 mL | Freq: Once | INTRAVENOUS | Status: AC
  Administered 2017-05-26: 500 mL via INTRAVENOUS

## 2017-05-26 MED ORDER — PROCHLORPERAZINE EDISYLATE 5 MG/ML IJ SOLN
10.0000 mg | Freq: Once | INTRAMUSCULAR | Status: AC
  Administered 2017-05-26: 10 mg via INTRAVENOUS
  Filled 2017-05-26: qty 2

## 2017-05-26 MED ORDER — ONDANSETRON HCL 4 MG/2ML IJ SOLN
4.0000 mg | Freq: Once | INTRAMUSCULAR | Status: AC
  Administered 2017-05-26: 4 mg via INTRAVENOUS
  Filled 2017-05-26: qty 2

## 2017-05-26 MED ORDER — KETOROLAC TROMETHAMINE 30 MG/ML IJ SOLN
15.0000 mg | Freq: Once | INTRAMUSCULAR | Status: AC
  Administered 2017-05-26: 15 mg via INTRAVENOUS
  Filled 2017-05-26: qty 1

## 2017-05-26 NOTE — ED Notes (Signed)
Pt taken to CT at this time.

## 2017-05-26 NOTE — ED Notes (Signed)
This RN to bedside, pt requesting bed to be adjusted at this time.

## 2017-05-26 NOTE — ED Notes (Signed)
NAD noted at time of D/C. Pt denies questions or concerns. Pt ambulatory to the lobby at this time.  

## 2017-05-26 NOTE — ED Triage Notes (Signed)
Patient presents to [facility] EC via POV with c/o right sided head pain for 24 hours. + Blurred vision and lacrimation from right eye. Denies any other complaints

## 2017-05-26 NOTE — ED Notes (Signed)
This RN to bedside, pt visualized in NAD at this time. Pt resting in bed with family at bedside. Will continue to monitor for further patient needs. Caryl Pina, RN administered medications. This RN apologized for delay, pt states understanding. Will continue to monitor for further patient needs.

## 2017-05-26 NOTE — ED Provider Notes (Addendum)
[facility] Emergency Department Provider Note  ____________________________________________   I have reviewed the triage vital signs and the nursing notes.   HISTORY  Chief Complaint Headache    HPI Michelle Manning is a 71 y.o. female Who states that she has had a right-sided headache off and on for several years, 6 or 7 since her last TIA. Usually, when she takes Motrin goes away. Over the last 48 hours however she had a gradual onset headache which has not gone away as usual with Motrin. She's had some increased lacrimation on the right but she denies any change in vision. S fever or chinic headaches although its persis it is worse when she changes position or moves her head. She's had no neurologic deficit no vomiting she has had no difficulty speaking or talking or walking. She denies any trauma or abdominal pain. It does not hurt hen she touchesthe side of he teeth, so she took out her false tt that did not  Some "oil" in her right  Ear, which did not help either. No recent illness. Denies any sinus pain or pressure, denies sinus drainage. The pain is mostly to theparietal occipital region of the head.She is not sure if it's the worst headache of life because she's had similar headaches.     Past Medical History:  Diagnosis Date  . Cancer [facility]) 2011   breast- Right  . Hypertension   . Personal history of chemotherapy   . Personal history of radiation therapy   . Stroke [facility])    tia few years ago    Patient Active Problem List   Diagnosis Date Noted  . Primary cancer of right breast ([place]) 07/25/2016    Past Surgical History:  Procedure Laterality Date  . ABDOMINAL HYSTERECTOMY    . BREAST BIOPSY Right 2011   radation and chemo  . BREAST SURGERY     times 2  . CHOLECYSTECTOMY    . PORT-A-CATH REMOVAL Left 02/04/2015   Procedure: REMOVAL PORT-A-CATH;  Surgeon: III Dia Crawford, MD;  Location: [facility] ORS;  Service: General;  Laterality: Left;  . PORTACATH  PLACEMENT    . TONSILLECTOMY      Prior to Admission medications   Medication Sig Start Date End Date Taking? Authorizing Provider  ALPRAZolam (XANAX) 0.25 MG tablet Take 1 tablet (0.25 mg total) by mouth 2 (two) times daily as needed for anxiety. 07/27/16   Lloyd Huger, MD  aspirin 325 MG tablet Take 325 mg by mouth daily.    [provider]  atenolol (TENORMIN) 25 MG tablet Take by mouth daily.    [provider]  lisinopril-hydrochlorothiazide (PRINZIDE,ZESTORETIC) 20-25 MG per tablet Take 1 tablet by mouth daily.    [provider]  Multiple Vitamin (MULTIVITAMIN) capsule Take 1 capsule by mouth daily.    [provider]  oxyCODONE-acetaminophen (ROXICET) 5-325 MG tablet Take 1 tablet by mouth every 6 (six) hours as needed for moderate pain. 09/05/16   Sable Feil, PA-C    Allergies Peanuts [peanut oil]  History reviewed. No pertinent family history.  Social History Social History  Substance Use Topics  . Smoking status: Never Smoker  . Smokeless tobacco: Never Used  . Alcohol use No    Review of Systems Constitutional: No fever/chills Eyes: No visual changes. ENT: No sore throat. No stiff neck no neck pain Cardiovascular: Denies chest pain. Respiratory: Denies shortness of breath. Gastrointestinal:   no vomiting.  No diarrhea.  No constipation. Genitourinary:  Negative for dysuria. Musculoskeletal: Negative lower extremity swelling Skin: Negative for rash. Neurological: see history of present illness  ____________________________________________   PHYSICAL EXAM:  VITAL SIGNS: ED Triage Vitals  Enc Vitals Group     BP 05/26/17 1049 128/74     Pulse Rate 05/26/17 1049 (!) 113     Resp 05/26/17 1049 20     Temp 05/26/17 1049 98.3 F (36.8 C)     Temp Source 05/26/17 1049 Oral     SpO2 05/26/17 1049 96 %     Weight 05/26/17 1050 234 lb (106.1 kg)     Height 05/26/17 1050 5\' 10"  (1.778 m)     Head Circumference --       Peak Flow --      Pain Score 05/26/17 1045 10     Pain Loc --      Pain Edu? --      Excl. in [place]? --     Constitutional: Alert and oriented. Well appearing and in no acute distress.occasionally will grab the side of her head and say that it hurts and then relax Eyes: Conjunctivae are normal, no conjunctivalissues, there is no ocular pain the pupils are equally round and reactive to light, there is no pain with ranging the eye, there is no proptosis, to gross exam the eye is soft Head: Atraumatic, TMs normal bilaterally, and there is no tenderness to palpation along the domain of the temporal artery. HEENT: No congestion/rhinnorhea. Mucous membranes are moist.  Oropharynx non-erythematous Neck:   Nontender with no meningismus, no masses, no stridor Cardiovascular: Normal rate, regular rhythm. Grossly normal heart sounds.  Good peripheral circulation. Respiratory: Normal respiratory effort.  No retractions. Lungs CTAB. Abdominal: Soft and nontender. No distention. No guarding no rebound Back:  There is no focal tenderness or step off.  there is no midline tenderness there are no lesions noted. there is no CVA tenderness Musculoskeletal: No lower extremity tenderness, no upper extremity tenderness. No joint effusions, no DVT signs strong distal pulses no edema Neurologic:  Cranial nerves II through XII are grossly intact 5 out of 5 strength bilateral upper and lower extremity. Finger to nose within normal limits heel to shin within normal limits, speech is normal with no word finding difficulty or dysarthria, reflexes symmetric, pupils are equally round and reactive to light, there is no pronator drift, sensation is normal, vision is intact to confrontation, gait is deferred, there is no nystagmus, normal neurologic exam Skin:  Skin is warm, dry and intact. No rash noted. Psychiatric: Mood and affect are normal. Speech and behavior are normal.  ____________________________________________    LABS (all labs ordered are listed, but only abnormal results are displayed)  Labs Reviewed  CBC WITH DIFFERENTIAL/PLATELET - Abnormal; Notable for the following:       Result Value   RDW 14.8 (*)    All other components within normal limits  BASIC METABOLIC PANEL  SEDIMENTATION RATE   ____________________________________________  EKG  I personally interpreted any EKGs ordered by me or triage  ____________________________________________  RADIOLOGY  I reviewed any imaging ordered by me or triage that were performed during my shift and, if possible, patient and/or family made aware of any abnormal findings. ____________________________________________   PROCEDURES  Procedure(s) performed: None  Procedures  Critical Care performed: None  ____________________________________________   INITIAL IMPRESSION / ASSESSMENT AND PLAN / ED COURSE  Pertinent labs & imaging results that were available during my care of the patient were reviewed by me and  considered in my medical decision making (see chart for details).  Pt with chronic recurrent ha presents w/ ha. No fever no chills normal neuro exam. No evidence of glaucoma on exam for this parietal/occipital ha, no evidence of cva or neuro deficit. She is well appearing. ddx is long.  Low suspicion for TA given exam but will send sed rate. Low suspicion for bleed but will obtain ct head/face and given chronic recurrent nature of the ha will try high flow 02 to see if it helps  ----------------------------------------- 12:06 PM on 05/26/2017 -----------------------------------------  Pt on high flow 02, says she feels better with still 'flashes' of headache when she moves the wrong way but pain is otherwise diminished.   Remains neuro intact. Cbc reassuring.   ----------------------------------------- 12:29 PM on 05/26/2017 -----------------------------------------  Pain much better on O2. Awaiting ct results. She declines  other pain meds.   ----------------------------------------- 12:46 PM on 05/26/2017 -----------------------------------------  Patient sedimentation rate is somewhat elevated but give age, it is not markedly so. She has no pain with chewing, no reproducible jaw pain, no fever,no temporal pain, no scalp tenderness, no other real signs or symptoms of temporal arteritis that I can reproduce. She does have a chronic headache which is often associated with lacrimation which is been there off and on for 7 years. She is currently much better with IV fluid and oxygen. I will try migraine medication as well. Low suspicion for mass or bleed or meningitis.   ----------------------------------------- 1:35 PM on 05/26/2017 -----------------------------------------  Cranial nerves II through XII are grossly intact 5 out of 5 strength bilateral upper and lower extremity. Finger to nose within normal limits heel to shin within normal limits, speech is normal with no word finding difficulty or dysarthria, reflexes symmetric, pupils are equally round and reactive to light, there is no pronator drift, sensation is normal, vision is intact to confrontation, gait is deferred, there is no nystagmus, normal neurologic exam  Patient's headache is 100% gone after Toradol and Compazine. She declines further intervention and would like go home. She states that this is the same headaches she usually has. There is no advice at this time to indicate bleed and meningtis dural thrombosis, temporal arteritis, glaucoma or other significant pathology. She is in nad and requesting d/c.    ____________________________________________   FINAL CLINICAL IMPRESSION(S) / ED DIAGNOSES  Final diagnoses:  None      This chart was dictated using voice recognition software.  Despite best efforts to proofread,  errors can occur which can change meaning.      Schuyler Amor, MD 05/26/17 1207    Schuyler Amor, MD 05/26/17  1230    Schuyler Amor, MD 05/26/17 1248    Schuyler Amor, MD 05/26/17 [phone]

## 2017-05-26 NOTE — ED Notes (Signed)
Pt changed into gown by this RN, placed on cardiac monitor. Pt placed on NRB by this RN per MD order.

## 2017-07-24 NOTE — Progress Notes (Signed)
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  INTERVAL HISTORY: Patient returns to clinic today for routine yearly follow-up.  She currently feels well and is asymptomatic. She has had "a good year" and has no further anxiety. She continues to remain very active.  She has no neurologic complaints.  She does not complain of weakness and fatigue. She denies any fevers or chills.  She has no chest pain, cough, or shortness of breath.  She denies any nausea, vomiting, constipation, or diarrhea.  She has no urinary complaints.  Patient offers no specific complaints today.   REVIEW OF SYSTEMS:   Review of Systems  Constitutional: Negative.  Negative for fever and malaise/fatigue.  Respiratory: Negative.  Negative for cough and shortness of breath.   Cardiovascular: Negative.  Negative for chest pain and leg swelling.  Gastrointestinal: Negative.  Negative for abdominal pain.  Genitourinary: Negative.   Musculoskeletal: Negative.   Skin: Negative.  Negative for rash.  Neurological: Negative.  Negative for sensory change and weakness.  Psychiatric/Behavioral: Negative.  The patient is not nervous/anxious and does not have insomnia.     As per HPI. Otherwise, a complete review of systems is negative.  PAST MEDICAL HISTORY: Past Medical History:  Diagnosis Date  . Cancer [facility]) 2011   breast- Right  . Hypertension   . Personal history of chemotherapy   . Personal history of radiation therapy   . Stroke ([place])    tia few years ago    PAST SURGICAL HISTORY: Past Surgical History:  Procedure Laterality Date  . ABDOMINAL HYSTERECTOMY    . BREAST BIOPSY Right 2011   radation and  chemo  . BREAST SURGERY     times 2  . CHOLECYSTECTOMY    . PORT-A-CATH REMOVAL Left 02/04/2015   Procedure: REMOVAL PORT-A-CATH;  Surgeon: III Dia Crawford, MD;  Location: [facility] ORS;  Service: General;  Laterality: Left;  . PORTACATH PLACEMENT    . TONSILLECTOMY      FAMILY HISTORY: Reviewed and unchanged. No reported history of malignancy or chronic disease.     ADVANCED DIRECTIVES:    HEALTH MAINTENANCE: Social History  Substance Use Topics  . Smoking status: Never Smoker  . Smokeless tobacco: Never Used  . Alcohol use No     Colonoscopy:  PAP:  Bone density:  Lipid panel:  Allergies  Allergen Reactions  . Peanuts [Peanut Oil] Hives    Current Outpatient Prescriptions  Medication Sig Dispense Refill  . ALPRAZolam (XANAX) 0.25 MG tablet Take 1 tablet (0.25 mg total) by mouth 2 (two) times daily as needed for anxiety. 30 tablet 0  . aspirin 325 MG tablet Take 325 mg by mouth daily.    Marland Kitchen atenolol (TENORMIN) 25 MG tablet Take by mouth daily.    Marland Kitchen lisinopril-hydrochlorothiazide (PRINZIDE,ZESTORETIC) 20-25 MG per tablet Take 1 tablet by mouth daily.    . Multiple Vitamin (MULTIVITAMIN) capsule Take 1 capsule by mouth daily.    Marland Kitchen oxyCODONE-acetaminophen (ROXICET) 5-325 MG tablet Take 1 tablet by mouth every 6 (six) hours as needed for moderate pain. 12 tablet 0   No current facility-administered medications for this visit.     OBJECTIVE: Vitals:   07/26/17 1031  BP: (!) 165/95  Pulse: 95  Resp: 20  Temp: 97.7 F (36.5 C)     Body mass index is 34.8 kg/m.    ECOG FS:0 - Asymptomatic  General: Well-developed, well-nourished, no acute distress. Eyes: Pink conjunctiva, anicteric sclera. Breasts: Patient requested exam be deferred today. Lungs: Clear to auscultation bilaterally. Heart: Regular rate and rhythm. No rubs, murmurs, or gallops. Abdomen: Soft, nontender, nondistended. No organomegaly noted, normoactive bowel sounds. Musculoskeletal: No edema, cyanosis, or  clubbing. Neuro: Alert, answering all questions appropriately. Cranial nerves grossly intact. Skin: No rashes or petechiae noted. Psych: Normal affect.   LAB RESULTS:  Lab Results  Component Value Date   NA 141 05/26/2017   K 3.6 05/26/2017   CL 103 05/26/2017   CO2 30 05/26/2017   GLUCOSE 139 (H) 05/26/2017   BUN 12 05/26/2017   CREATININE 0.74 05/26/2017   CALCIUM 9.5 05/26/2017   PROT 7.2 07/07/2014   ALBUMIN 3.5 07/07/2014   AST 18 07/07/2014   ALT 24 07/07/2014   ALKPHOS 54 07/07/2014   BILITOT 0.4 07/07/2014   GFRNONAA >60 05/26/2017   GFRAA >60 05/26/2017    Lab Results  Component Value Date   WBC 8.2 05/26/2017   NEUTROABS 6.1 05/26/2017   HGB 12.3 05/26/2017   HCT 36.0 05/26/2017   MCV 85.5 05/26/2017   PLT 256 05/26/2017   Lab Results  Component Value Date   LABCA2 15.4 07/27/2016     STUDIES: No results found.  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  PLAN:    1.  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  Patient completed adjuvant chemotherapy in approximately 2011.  Patient completed 5 year of letrozole in September 2016. Patient's most recent mammogram on November 21, 2016 was reported as BI-RADS 2, repeat in one year. Bone mineral density on November 21, 2016 was reported as normal.  Return to clinic in 1 year with repeat laboratory work and further evaluation. 2. Anxiety/panic attacks: Resolved.  Approximately 20 minutes was spent in discussion of which greater than 50% was consultation.  Patient expressed understanding and was in agreement with this plan. She also understands that She can call clinic at any time with any questions, concerns, or complaints.    Lloyd Huger, MD   07/26/2017 1:49 PM

## 2017-07-26 ENCOUNTER — Inpatient Hospital Stay (HOSPITAL_BASED_OUTPATIENT_CLINIC_OR_DEPARTMENT_OTHER): Payer: Medicare HMO | Admitting: Oncology

## 2017-07-26 ENCOUNTER — Inpatient Hospital Stay: Payer: Medicare HMO | Attending: Oncology

## 2017-07-26 VITALS — BP 165/95 | HR 95 | Temp 97.7°F | Resp 20 | Wt 242.5 lb

## 2017-07-26 DIAGNOSIS — Z923 Personal history of irradiation: Secondary | ICD-10-CM | POA: Insufficient documentation

## 2017-07-26 DIAGNOSIS — Z9221 Personal history of antineoplastic chemotherapy: Secondary | ICD-10-CM | POA: Insufficient documentation

## 2017-07-26 DIAGNOSIS — Z8673 Personal history of transient ischemic attack (TIA), and cerebral infarction without residual deficits: Secondary | ICD-10-CM | POA: Diagnosis not present

## 2017-07-26 DIAGNOSIS — Z9223 Personal history of estrogen therapy: Secondary | ICD-10-CM

## 2017-07-26 DIAGNOSIS — C50911 Malignant neoplasm of unspecified site of right female breast: Secondary | ICD-10-CM

## 2017-07-26 DIAGNOSIS — Z7982 Long term (current) use of aspirin: Secondary | ICD-10-CM | POA: Insufficient documentation

## 2017-07-26 DIAGNOSIS — Z79899 Other long term (current) drug therapy: Secondary | ICD-10-CM

## 2017-07-26 DIAGNOSIS — I10 Essential (primary) hypertension: Secondary | ICD-10-CM

## 2017-07-26 DIAGNOSIS — Z170 Estrogen receptor positive status [ER+]: Secondary | ICD-10-CM | POA: Insufficient documentation

## 2017-07-26 DIAGNOSIS — Z853 Personal history of malignant neoplasm of breast: Secondary | ICD-10-CM | POA: Insufficient documentation

## 2017-07-26 NOTE — Progress Notes (Signed)
Patient denies any concerns today.  

## 2017-07-27 LAB — CANCER ANTIGEN 27.29: CA 27.29: 12.4 U/mL (ref 0.0–38.6)

## 2017-08-13 DIAGNOSIS — M25512 Pain in left shoulder: Secondary | ICD-10-CM | POA: Insufficient documentation

## 2017-08-13 DIAGNOSIS — K219 Gastro-esophageal reflux disease without esophagitis: Secondary | ICD-10-CM | POA: Insufficient documentation

## 2017-11-25 ENCOUNTER — Ambulatory Visit
Admission: RE | Admit: 2017-11-25 | Discharge: 2017-11-25 | Disposition: A | Discharge status: Home / Self Care | Payer: Medicare HMO | Source: Ambulatory Visit | Attending: Oncology | Admitting: Oncology

## 2017-11-25 DIAGNOSIS — C50911 Malignant neoplasm of unspecified site of right female breast: Secondary | ICD-10-CM | POA: Insufficient documentation

## 2017-11-25 DIAGNOSIS — Z1231 Encounter for screening mammogram for malignant neoplasm of breast: Secondary | ICD-10-CM | POA: Diagnosis present

## 2017-11-25 HISTORY — DX: Malignant neoplasm of unspecified site of unspecified female breast: C50.919

## 2018-04-01 NOTE — Congregational Nurse Program (Signed)
Congregational Nurse Program Note  Date of Encounter: 04/01/2018  Past Medical History: Past Medical History:  Diagnosis Date  . Breast cancer ([place]) 2011   rt lumpectomy/ chemo/rad  . Cancer [facility]) 2011   breast- Right  . Hypertension   . Personal history of chemotherapy   . Personal history of radiation therapy   . Stroke [facility])    tia few years ago    Encounter Details: CNP Questionnaire - 03/12/18 1020      Questionnaire   Patient Status  Not Applicable    Race  Black or African American    Location Patient Served At  [employer], [place]  No food insecurities    Housing/Utilities  Yes, have permanent housing    Transportation  No transportation needs    Interpersonal Safety  Yes, feel physically and emotionally safe where you currently live    Medication  No medication insecurities    Medical Provider  Yes    Referrals  Not Applicable    ED Visit Averted  Not Applicable    Life-Saving Intervention Made  Not Applicable     Seen at the [facility] B/P 110/70- P80 No complaints Erma Heritage RN, [place] Program [phone]

## 2018-06-29 NOTE — Congregational Nurse Program (Signed)
Congregational Nurse Program Note  Date of Encounter: 06/29/2018  Past Medical History: Past Medical History:  Diagnosis Date  . Breast cancer ([place]) 2011   rt lumpectomy/ chemo/rad  . Cancer [facility]) 2011   breast- Right  . Hypertension   . Personal history of chemotherapy   . Personal history of radiation therapy   . Stroke [facility])    tia few years ago    Encounter Details: CNP Questionnaire - 06/04/18 2256      Questionnaire   Patient Status  Not Applicable    Race  Black or African American    Location Patient Served At  [employer], [place]  No food insecurities    Housing/Utilities  Yes, have permanent housing    Transportation  No transportation needs    Interpersonal Safety  Yes, feel physically and emotionally safe where you currently live    Medication  No medication insecurities    Medical Provider  Yes    Referrals  Not Applicable    ED Visit Averted  Not Applicable    Life-Saving Intervention Made  Not Applicable     Seen at the [employer] pantry No complaints [address] [place], [place] Program [phone]

## 2018-07-20 NOTE — Progress Notes (Deleted)
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  INTERVAL HISTORY: Patient returns to clinic today for routine yearly follow-up.  She currently feels well and is asymptomatic. She has had "a good year" and has no further anxiety. She continues to remain very active.  She has no neurologic complaints.  She does not complain of weakness and fatigue. She denies any fevers or chills.  She has no chest pain, cough, or shortness of breath.  She denies any nausea, vomiting, constipation, or diarrhea.  She has no urinary complaints.  Patient offers no specific complaints today.   REVIEW OF SYSTEMS:   Review of Systems  Constitutional: Negative.  Negative for fever and malaise/fatigue.  Respiratory: Negative.  Negative for cough and shortness of breath.   Cardiovascular: Negative.  Negative for chest pain and leg swelling.  Gastrointestinal: Negative.  Negative for abdominal pain.  Genitourinary: Negative.   Musculoskeletal: Negative.   Skin: Negative.  Negative for rash.  Neurological: Negative.  Negative for sensory change and weakness.  Psychiatric/Behavioral: Negative.  The patient is not nervous/anxious and does not have insomnia.     As per HPI. Otherwise, a complete review of systems is negative.  PAST MEDICAL HISTORY: Past Medical History:  Diagnosis Date  . Breast cancer ([place]) 2011   rt lumpectomy/ chemo/rad  . Cancer [facility]) 2011   breast- Right  . Hypertension   . Personal history of chemotherapy   . Personal history of radiation therapy   . Stroke ([place])    tia few years ago    PAST SURGICAL HISTORY: Past Surgical History:  Procedure Laterality Date  . ABDOMINAL  HYSTERECTOMY    . BREAST BIOPSY Right 2011   radation and chemo  . BREAST LUMPECTOMY Right 2011  . BREAST SURGERY     times 2  . CHOLECYSTECTOMY    . PORT-A-CATH REMOVAL Left 02/04/2015   Procedure: REMOVAL PORT-A-CATH;  Surgeon: III Dia Crawford, MD;  Location: [facility] ORS;  Service: General;  Laterality: Left;  . PORTACATH PLACEMENT    . TONSILLECTOMY      FAMILY HISTORY: Reviewed and unchanged. No reported history of malignancy or chronic disease.     ADVANCED DIRECTIVES:    HEALTH MAINTENANCE: Social History   Tobacco Use  . Smoking status: Never Smoker  . Smokeless tobacco: Never Used  Substance Use Topics  . Alcohol use: No  . Drug use: No     Colonoscopy:  PAP:  Bone density:  Lipid panel:  Allergies  Allergen Reactions  . Peanuts [Peanut Oil] Hives    Current Outpatient Medications  Medication Sig Dispense Refill  . ALPRAZolam (XANAX) 0.25 MG tablet Take 1 tablet (0.25 mg total) by mouth 2 (two) times daily as needed for anxiety. 30 tablet 0  . aspirin 325 MG tablet Take 325 mg by mouth daily.    Marland Kitchen atenolol (TENORMIN) 25 MG tablet Take by mouth daily.    Marland Kitchen lisinopril-hydrochlorothiazide (PRINZIDE,ZESTORETIC) 20-25 MG per tablet Take 1 tablet by mouth daily.    . Multiple Vitamin (MULTIVITAMIN) capsule Take 1 capsule by mouth daily.    Marland Kitchen oxyCODONE-acetaminophen (ROXICET) 5-325 MG tablet Take 1 tablet by mouth every 6 (six) hours as needed for moderate pain. 12 tablet 0  No current facility-administered medications for this visit.     OBJECTIVE: There were no vitals filed for this visit.   There is no height or weight on file to calculate BMI.    ECOG FS:0 - Asymptomatic  General: Well-developed, well-nourished, no acute distress. Eyes: Pink conjunctiva, anicteric sclera. Breasts: Patient requested exam be deferred today. Lungs: Clear to auscultation bilaterally. Heart: Regular rate and rhythm. No rubs, murmurs, or gallops. Abdomen: Soft, nontender,  nondistended. No organomegaly noted, normoactive bowel sounds. Musculoskeletal: No edema, cyanosis, or clubbing. Neuro: Alert, answering all questions appropriately. Cranial nerves grossly intact. Skin: No rashes or petechiae noted. Psych: Normal affect.   LAB RESULTS:  Lab Results  Component Value Date   NA 141 05/26/2017   K 3.6 05/26/2017   CL 103 05/26/2017   CO2 30 05/26/2017   GLUCOSE 139 (H) 05/26/2017   BUN 12 05/26/2017   CREATININE 0.74 05/26/2017   CALCIUM 9.5 05/26/2017   PROT 7.2 07/07/2014   ALBUMIN 3.5 07/07/2014   AST 18 07/07/2014   ALT 24 07/07/2014   ALKPHOS 54 07/07/2014   BILITOT 0.4 07/07/2014   GFRNONAA >60 05/26/2017   GFRAA >60 05/26/2017    Lab Results  Component Value Date   WBC 8.2 05/26/2017   NEUTROABS 6.1 05/26/2017   HGB 12.3 05/26/2017   HCT 36.0 05/26/2017   MCV 85.5 05/26/2017   PLT 256 05/26/2017   Lab Results  Component Value Date   LABCA2 15.4 07/27/2016     STUDIES: No results found.  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  PLAN:    1.  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  Patient completed adjuvant chemotherapy in approximately 2011.  Patient completed 5 year of letrozole in September 2016. Patient's most recent mammogram on November 21, 2016 was reported as BI-RADS 2, repeat in one year. Bone mineral density on November 21, 2016 was reported as normal.  Return to clinic in 1 year with repeat laboratory work and further evaluation. 2. Anxiety/panic attacks: Resolved.  Approximately 20 minutes was spent in discussion of which greater than 50% was consultation.  Patient expressed understanding and was in agreement with this plan. She also understands that She can call clinic at any time with any questions, concerns, or complaints.    Lloyd Huger, MD   07/20/2018 10:02 AM

## 2018-07-25 ENCOUNTER — Inpatient Hospital Stay: Payer: Medicare HMO | Attending: Oncology | Admitting: Oncology

## 2018-07-25 ENCOUNTER — Inpatient Hospital Stay: Payer: Medicare HMO

## 2018-08-04 DIAGNOSIS — D179 Benign lipomatous neoplasm, unspecified: Secondary | ICD-10-CM | POA: Insufficient documentation

## 2018-08-04 DIAGNOSIS — R1031 Right lower quadrant pain: Secondary | ICD-10-CM | POA: Insufficient documentation

## 2018-08-13 ENCOUNTER — Other Ambulatory Visit: Payer: Self-pay | Admitting: Surgery

## 2018-08-13 DIAGNOSIS — D171 Benign lipomatous neoplasm of skin and subcutaneous tissue of trunk: Secondary | ICD-10-CM

## 2018-08-15 ENCOUNTER — Other Ambulatory Visit
Admission: RE | Admit: 2018-08-15 | Discharge: 2018-08-15 | Disposition: A | Discharge status: Home / Self Care | Payer: Medicare HMO | Source: Ambulatory Visit | Attending: Surgery | Admitting: Surgery

## 2018-08-15 ENCOUNTER — Encounter (INDEPENDENT_AMBULATORY_CARE_PROVIDER_SITE_OTHER): Payer: Self-pay

## 2018-08-15 ENCOUNTER — Ambulatory Visit
Admission: RE | Admit: 2018-08-15 | Discharge: 2018-08-15 | Disposition: A | Discharge status: Home / Self Care | Payer: Medicare HMO | Source: Ambulatory Visit | Attending: Surgery | Admitting: Surgery

## 2018-08-15 DIAGNOSIS — D171 Benign lipomatous neoplasm of skin and subcutaneous tissue of trunk: Secondary | ICD-10-CM | POA: Diagnosis present

## 2018-08-15 LAB — CREATININE, SERUM: CREATININE: 0.68 mg/dL (ref 0.44–1.00)

## 2018-08-15 MED ORDER — GADOBUTROL 1 MMOL/ML IV SOLN
10.0000 mL | Freq: Once | INTRAVENOUS | Status: AC | PRN
  Administered 2018-08-15: 10 mL via INTRAVENOUS

## 2018-08-25 ENCOUNTER — Ambulatory Visit: Payer: Self-pay | Admitting: Surgery

## 2018-08-25 NOTE — H&P (Signed)
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 female who was referred by [facility]* for evaluation of above. First noted 10-15 yrs years ago.  Symptoms include: Pain is sharp, intermittent, with pressure to area.  Exacerbated by pressure.  Alleviated by relieving pressure.  Associated with nothing specific.  Slowly enlarging overtime.  Had similar one removed on contralateral side before, thinks it was benign.     Past Medical History:  has a past medical history of Cancer (CMS-HCC), Depression, Hyperlipidemia, and Hypertension.  Past Surgical History:  has a past surgical history that includes Cholecystectomy; Hysterectomy; Tonsillectomy; and Breast excisional biopsy.  Family History: family history is not on file.  Social History:  reports that she has never smoked. She has never used smokeless tobacco. She reports that she does not drink alcohol or use drugs.  Current Medications: has a current medication list which includes the following prescription(s): acetaminophen, amlodipine, aspirin, lisinopril-hydrochlorothiazide, pravastatin, alprazolam, atenolol, multivitamin, oxycodone-acetaminophen, and sertraline.  Allergies:      Allergies  Allergen Reactions  . Peanut Hives  . Peanut Oil Hives    ROS:  A 15 point review of systems was performed and pertinent positives and negatives noted in HPI   Objective:   BP 137/82   Pulse 81   Temp 36.1 C (97 F) (Oral)   Ht 167.6 cm (5\' 6" )   Wt 99.8 kg (220 lb)   BMI 35.51 kg/m   Constitutional :  alert, appears stated age, cooperative and no distress  Lymphatics/Throat:  no asymmetry, masses, or scars  Respiratory:  clear to auscultation bilaterally  Cardiovascular:  regular rate and rhythm  Gastrointestinal: soft, non-tender; bowel sounds normal; no masses,  no organomegaly.    Musculoskeletal: Steady gait and movement  Skin: Cool and moist.  Large 20 cm x 15cm overall  soft, somewhat mobile with areas of increased firmness and tenderness noted on right lower scapula area.  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  Alternatives include continued observation.  Benefits include possible symptom relief, pathologic evaluation, improved cosmesis. Discussed the risk of surgery including recurrence, chronic pain, post-op infxn, poor cosmesis, poor/delayed wound healing, and possible re-operation to address said risks. The risks of general anesthetic, if used, includes MI, CVA, sudden death or even reaction to anesthetic medications also discussed.  Typical post-op recovery time of 3-5 days with possible activity restrictions were also discussed.  The patient verbalized understanding and all questions were answered to the patient's satisfaction.  2. Due to size and somewhat irregular contour on exam, will obtain MRI prior to excision to ensure no signs of malignancy and/or involvement of deeper structures.  Pt verbalized understanding.

## 2018-08-25 NOTE — H&P (View-Only) (Signed)
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 female who was referred by [facility]* for evaluation of above. First noted 10-15 yrs years ago.  Symptoms include: Pain is sharp, intermittent, with pressure to area.  Exacerbated by pressure.  Alleviated by relieving pressure.  Associated with nothing specific.  Slowly enlarging overtime.  Had similar one removed on contralateral side before, thinks it was benign.     Past Medical History:  has a past medical history of Cancer (CMS-HCC), Depression, Hyperlipidemia, and Hypertension.  Past Surgical History:  has a past surgical history that includes Cholecystectomy; Hysterectomy; Tonsillectomy; and Breast excisional biopsy.  Family History: family history is not on file.  Social History:  reports that she has never smoked. She has never used smokeless tobacco. She reports that she does not drink alcohol or use drugs.  Current Medications: has a current medication list which includes the following prescription(s): acetaminophen, amlodipine, aspirin, lisinopril-hydrochlorothiazide, pravastatin, alprazolam, atenolol, multivitamin, oxycodone-acetaminophen, and sertraline.  Allergies:      Allergies  Allergen Reactions  . Peanut Hives  . Peanut Oil Hives    ROS:  A 15 point review of systems was performed and pertinent positives and negatives noted in HPI   Objective:   BP 137/82   Pulse 81   Temp 36.1 C (97 F) (Oral)   Ht 167.6 cm (5\' 6" )   Wt 99.8 kg (220 lb)   BMI 35.51 kg/m   Constitutional :  alert, appears stated age, cooperative and no distress  Lymphatics/Throat:  no asymmetry, masses, or scars  Respiratory:  clear to auscultation bilaterally  Cardiovascular:  regular rate and rhythm  Gastrointestinal: soft, non-tender; bowel sounds normal; no masses,  no organomegaly.    Musculoskeletal: Steady gait and movement  Skin: Cool and moist.  Large 20 cm x 15cm overall  soft, somewhat mobile with areas of increased firmness and tenderness noted on right lower scapula area.  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  Alternatives include continued observation.  Benefits include possible symptom relief, pathologic evaluation, improved cosmesis. Discussed the risk of surgery including recurrence, chronic pain, post-op infxn, poor cosmesis, poor/delayed wound healing, and possible re-operation to address said risks. The risks of general anesthetic, if used, includes MI, CVA, sudden death or even reaction to anesthetic medications also discussed.  Typical post-op recovery time of 3-5 days with possible activity restrictions were also discussed.  The patient verbalized understanding and all questions were answered to the patient's satisfaction.  2. Due to size and somewhat irregular contour on exam, will obtain MRI prior to excision to ensure no signs of malignancy and/or involvement of deeper structures.  Pt verbalized understanding.

## 2018-09-01 ENCOUNTER — Encounter
Admission: RE | Admit: 2018-09-01 | Discharge: 2018-09-01 | Disposition: A | Discharge status: Home / Self Care | Payer: Medicare HMO | Source: Ambulatory Visit | Attending: Surgery | Admitting: Surgery

## 2018-09-01 ENCOUNTER — Other Ambulatory Visit: Payer: Self-pay

## 2018-09-01 DIAGNOSIS — D171 Benign lipomatous neoplasm of skin and subcutaneous tissue of trunk: Secondary | ICD-10-CM | POA: Diagnosis present

## 2018-09-01 DIAGNOSIS — Z7982 Long term (current) use of aspirin: Secondary | ICD-10-CM | POA: Diagnosis not present

## 2018-09-01 DIAGNOSIS — I10 Essential (primary) hypertension: Secondary | ICD-10-CM | POA: Diagnosis not present

## 2018-09-01 DIAGNOSIS — Z853 Personal history of malignant neoplasm of breast: Secondary | ICD-10-CM | POA: Diagnosis not present

## 2018-09-01 DIAGNOSIS — E785 Hyperlipidemia, unspecified: Secondary | ICD-10-CM | POA: Diagnosis not present

## 2018-09-01 DIAGNOSIS — Z01818 Encounter for other preprocedural examination: Secondary | ICD-10-CM

## 2018-09-01 DIAGNOSIS — Z79899 Other long term (current) drug therapy: Secondary | ICD-10-CM | POA: Diagnosis not present

## 2018-09-01 DIAGNOSIS — F329 Major depressive disorder, single episode, unspecified: Secondary | ICD-10-CM | POA: Diagnosis not present

## 2018-09-01 LAB — CBC
HEMATOCRIT: 37.7 % (ref 36.0–46.0)
HEMOGLOBIN: 12.1 g/dL (ref 12.0–15.0)
MCH: 28.3 pg (ref 26.0–34.0)
MCHC: 32.1 g/dL (ref 30.0–36.0)
MCV: 88.3 fL (ref 80.0–100.0)
NRBC: 0 % (ref 0.0–0.2)
Platelets: 277 10*3/uL (ref 150–400)
RBC: 4.27 MIL/uL (ref 3.87–5.11)
RDW: 14 % (ref 11.5–15.5)
WBC: 6.1 10*3/uL (ref 4.0–10.5)

## 2018-09-01 LAB — BASIC METABOLIC PANEL
ANION GAP: 8 (ref 5–15)
BUN: 8 mg/dL (ref 8–23)
CHLORIDE: 102 mmol/L (ref 98–111)
CO2: 31 mmol/L (ref 22–32)
Calcium: 9.7 mg/dL (ref 8.9–10.3)
Creatinine, Ser: 0.61 mg/dL (ref 0.44–1.00)
GFR calc Af Amer: >60 mL/min (ref >60)
GLUCOSE: 110 mg/dL — AB (ref 70–99)
POTASSIUM: 3.3 mmol/L — AB (ref 3.5–5.1)
Sodium: 141 mmol/L (ref 135–145)

## 2018-09-01 NOTE — Patient Instructions (Signed)
Your procedure is scheduled on: Thursday 09/04/18  Report to [place]. To find out your arrival time please call [phone] between 1PM - 3PM on Wednesday 09/03/18.  Remember: Instructions that are not followed completely may result in serious medical risk, up to and including death, or upon the discretion of your surgeon and anesthesiologist your surgery may need to be rescheduled.     _X__ 1. Do not eat food after midnight the night before your procedure.                 No gum chewing or hard candies. You may drink clear liquids up to 2 hours                 before you are scheduled to arrive for your surgery- DO NOT drink clear                 liquids within 2 hours of the start of your surgery.                 Clear Liquids include:  water, apple juice without pulp, clear carbohydrate                 drink such as Clearfast or Gatorade, Black Coffee or Tea (Do not add                 anything to coffee or tea).  __X__2.  On the morning of surgery brush your teeth with toothpaste and water, you may rinse your mouth with mouthwash if you wish.  Do not swallow any toothpaste or mouthwash.     __X__3.  Notify your doctor if there is any change in your medical condition      (cold, fever, infections).     Do not wear jewelry, make-up, hairpins, clips or nail polish. Do not wear lotions, powders, or perfumes.  Do not shave 48 hours prior to surgery. Men may shave face and neck. Do not bring valuables to the hospital.    [facility] is not responsible for any belongings or valuables.  Contacts, dentures/partials or body piercings may not be worn into surgery. Bring a case for your contacts, glasses or hearing aids, a denture cup will be supplied.    Patients discharged the day of surgery will not be allowed to drive home.    Please read over the following fact sheets that you were given:   MRSA Information   __X__ Take  these medicines the morning of surgery with A SIP OF WATER:     1. albuterol (PROVENTIL HFA;VENTOLIN HFA) 108 (90 Base) MCG/ACT inhaler  2. amLODipine (NORVASC) 10 MG tablet  3. acetaminophen (TYLENOL) 500 MG tablet if needed.    __X__ Use CHG Soap as directed  _ X___ Use inhalers on the day of surgery. Also bring the inhaler with you to the hospital on the morning of surgery.  __X__ Stop Blood Thinners Coumadin/Plavix/Xarelto/Pleta/Pradaxa/Eliquis/Effient/Aspirin TODAY.  __X__ Stop Anti-inflammatories 7 days before surgery such as Advil, Ibuprofen, Motrin, BC or Goodies Powder, Naprosyn, Naproxen, Aleve, Aspirin, Meloxicam. May take Tylenol if needed for pain or discomfort.   __X__ Stop all herbal supplements today.

## 2018-09-02 NOTE — Pre-Procedure Instructions (Signed)
Met B results sent to Dr. Lysle Pearl and Anesthesia for review.

## 2018-09-02 NOTE — Pre-Procedure Instructions (Addendum)
MESSAGED DR Lysle Pearl AFTER ADDRESSING PROTOCOL OF KT 3.3 NEEDING KT SUPPLEMENT.WITH DR Randa Lynn. SHE STATES KT3.0 OR GREATER OK AND WILL ADDRESS WITH ANESTHESIA TO GET PROTOCOL UPDATED. DR Lysle Pearl IS STARTING SUPPLEMENT

## 2018-09-03 MED ORDER — CEFAZOLIN SODIUM-DEXTROSE 2-4 GM/100ML-% IV SOLN
2.0000 g | INTRAVENOUS | Status: AC
  Administered 2018-09-04: 2 g via INTRAVENOUS

## 2018-09-04 ENCOUNTER — Ambulatory Visit: Payer: Medicare HMO | Admitting: Registered Nurse

## 2018-09-04 ENCOUNTER — Encounter
Admission: RE | Disposition: A | Discharge status: Home / Self Care | Payer: Self-pay | Source: Ambulatory Visit | Attending: Surgery

## 2018-09-04 ENCOUNTER — Other Ambulatory Visit: Payer: Self-pay

## 2018-09-04 ENCOUNTER — Ambulatory Visit
Admission: RE | Admit: 2018-09-04 | Discharge: 2018-09-04 | Disposition: A | Discharge status: Home / Self Care | Payer: Medicare HMO | Source: Ambulatory Visit | Attending: Surgery | Admitting: Surgery

## 2018-09-04 DIAGNOSIS — F329 Major depressive disorder, single episode, unspecified: Secondary | ICD-10-CM | POA: Insufficient documentation

## 2018-09-04 DIAGNOSIS — E785 Hyperlipidemia, unspecified: Secondary | ICD-10-CM | POA: Insufficient documentation

## 2018-09-04 DIAGNOSIS — Z79899 Other long term (current) drug therapy: Secondary | ICD-10-CM | POA: Insufficient documentation

## 2018-09-04 DIAGNOSIS — D171 Benign lipomatous neoplasm of skin and subcutaneous tissue of trunk: Secondary | ICD-10-CM | POA: Insufficient documentation

## 2018-09-04 DIAGNOSIS — Z853 Personal history of malignant neoplasm of breast: Secondary | ICD-10-CM | POA: Insufficient documentation

## 2018-09-04 DIAGNOSIS — I10 Essential (primary) hypertension: Secondary | ICD-10-CM | POA: Insufficient documentation

## 2018-09-04 DIAGNOSIS — Z7982 Long term (current) use of aspirin: Secondary | ICD-10-CM | POA: Insufficient documentation

## 2018-09-04 HISTORY — PX: LIPOMA EXCISION: SHX5283

## 2018-09-04 LAB — POCT I-STAT 4, (NA,K, GLUC, HGB,HCT)
Glucose, Bld: 104 mg/dL — ABNORMAL HIGH (ref 70–99)
HCT: 36 % (ref 36.0–46.0)
Hemoglobin: 12.2 g/dL (ref 12.0–15.0)
Potassium: 3 mmol/L — ABNORMAL LOW (ref 3.5–5.1)
SODIUM: 139 mmol/L (ref 135–145)

## 2018-09-04 SURGERY — EXCISION LIPOMA
Anesthesia: General | Laterality: Right

## 2018-09-04 MED ORDER — ONDANSETRON HCL 4 MG/2ML IJ SOLN
INTRAMUSCULAR | Status: DC | PRN
  Administered 2018-09-04: 4 mg via INTRAVENOUS

## 2018-09-04 MED ORDER — LIDOCAINE HCL (CARDIAC) PF 100 MG/5ML IV SOSY
PREFILLED_SYRINGE | INTRAVENOUS | Status: DC | PRN
  Administered 2018-09-04: 100 mg via INTRAVENOUS

## 2018-09-04 MED ORDER — HYDROCODONE-ACETAMINOPHEN 5-325 MG PO TABS
ORAL_TABLET | ORAL | Status: AC
  Filled 2018-09-04: qty 1

## 2018-09-04 MED ORDER — DEXAMETHASONE SODIUM PHOSPHATE 10 MG/ML IJ SOLN
INTRAMUSCULAR | Status: AC
  Filled 2018-09-04: qty 1

## 2018-09-04 MED ORDER — FENTANYL CITRATE (PF) 100 MCG/2ML IJ SOLN
INTRAMUSCULAR | Status: AC
  Filled 2018-09-04: qty 2

## 2018-09-04 MED ORDER — ROCURONIUM BROMIDE 50 MG/5ML IV SOLN
INTRAVENOUS | Status: AC
  Filled 2018-09-04: qty 1

## 2018-09-04 MED ORDER — PROPOFOL 10 MG/ML IV BOLUS
INTRAVENOUS | Status: DC | PRN
  Administered 2018-09-04: 50 mg via INTRAVENOUS
  Administered 2018-09-04: 150 mg via INTRAVENOUS

## 2018-09-04 MED ORDER — BUPIVACAINE-EPINEPHRINE (PF) 0.5% -1:200000 IJ SOLN
INTRAMUSCULAR | Status: DC | PRN
  Administered 2018-09-04: 13.5 mL

## 2018-09-04 MED ORDER — MIDAZOLAM HCL 2 MG/2ML IJ SOLN
INTRAMUSCULAR | Status: DC | PRN
  Administered 2018-09-04: 2 mg via INTRAVENOUS

## 2018-09-04 MED ORDER — FAMOTIDINE 20 MG PO TABS
ORAL_TABLET | ORAL | Status: AC
  Administered 2018-09-04: 20 mg via ORAL
  Filled 2018-09-04: qty 1

## 2018-09-04 MED ORDER — ACETAMINOPHEN 500 MG PO TABS
1000.0000 mg | ORAL_TABLET | ORAL | Status: AC
  Administered 2018-09-04: 1000 mg via ORAL

## 2018-09-04 MED ORDER — DOCUSATE SODIUM 100 MG PO CAPS: 100.0000 mg | ORAL_CAPSULE | Freq: Two times a day (BID) | ORAL | 0 refills | Status: AC | PRN

## 2018-09-04 MED ORDER — CHLORHEXIDINE GLUCONATE CLOTH 2 % EX PADS: 6.0000 | MEDICATED_PAD | Freq: Once | CUTANEOUS | Status: DC

## 2018-09-04 MED ORDER — LIDOCAINE HCL (PF) 2 % IJ SOLN
INTRAMUSCULAR | Status: AC
  Filled 2018-09-04: qty 10

## 2018-09-04 MED ORDER — EPHEDRINE SULFATE 50 MG/ML IJ SOLN
INTRAMUSCULAR | Status: AC
  Filled 2018-09-04: qty 1

## 2018-09-04 MED ORDER — HYDROCODONE-ACETAMINOPHEN 5-325 MG PO TABS: 1.0000 | ORAL_TABLET | Freq: Four times a day (QID) | ORAL | 0 refills | Status: AC | PRN

## 2018-09-04 MED ORDER — ONDANSETRON HCL 4 MG/2ML IJ SOLN
INTRAMUSCULAR | Status: AC
  Filled 2018-09-04: qty 2

## 2018-09-04 MED ORDER — GLYCOPYRROLATE 0.2 MG/ML IJ SOLN
INTRAMUSCULAR | Status: AC
  Filled 2018-09-04: qty 1

## 2018-09-04 MED ORDER — SUCCINYLCHOLINE CHLORIDE 20 MG/ML IJ SOLN
INTRAMUSCULAR | Status: DC | PRN
  Administered 2018-09-04: 100 mg via INTRAVENOUS

## 2018-09-04 MED ORDER — PROPOFOL 10 MG/ML IV BOLUS
INTRAVENOUS | Status: AC
  Filled 2018-09-04: qty 20

## 2018-09-04 MED ORDER — HYDROCODONE-ACETAMINOPHEN 5-325 MG PO TABS
1.0000 | ORAL_TABLET | Freq: Four times a day (QID) | ORAL | Status: DC | PRN
  Administered 2018-09-04: 1 via ORAL

## 2018-09-04 MED ORDER — LACTATED RINGERS IV SOLN
INTRAVENOUS | Status: DC
  Administered 2018-09-04: 06:00:00 via INTRAVENOUS

## 2018-09-04 MED ORDER — IBUPROFEN 800 MG PO TABS: 800.0000 mg | ORAL_TABLET | Freq: Three times a day (TID) | ORAL | 0 refills | Status: DC | PRN

## 2018-09-04 MED ORDER — ACETAMINOPHEN 500 MG PO TABS
ORAL_TABLET | ORAL | Status: AC
  Administered 2018-09-04: 1000 mg via ORAL
  Filled 2018-09-04: qty 2

## 2018-09-04 MED ORDER — DEXAMETHASONE SODIUM PHOSPHATE 10 MG/ML IJ SOLN
INTRAMUSCULAR | Status: DC | PRN
  Administered 2018-09-04: 10 mg via INTRAVENOUS

## 2018-09-04 MED ORDER — LIDOCAINE HCL (PF) 1 % IJ SOLN
INTRAMUSCULAR | Status: AC
  Filled 2018-09-04: qty 30

## 2018-09-04 MED ORDER — MIDAZOLAM HCL 2 MG/2ML IJ SOLN
INTRAMUSCULAR | Status: AC
  Filled 2018-09-04: qty 2

## 2018-09-04 MED ORDER — LIDOCAINE HCL 1 % IJ SOLN
INTRAMUSCULAR | Status: DC | PRN
  Administered 2018-09-04: 13.5 mL

## 2018-09-04 MED ORDER — OXYCODONE HCL 5 MG PO TABS: 5.0000 mg | ORAL_TABLET | Freq: Once | ORAL | Status: DC | PRN

## 2018-09-04 MED ORDER — EPHEDRINE SULFATE 50 MG/ML IJ SOLN
INTRAMUSCULAR | Status: DC | PRN
  Administered 2018-09-04: 5 mg via INTRAVENOUS

## 2018-09-04 MED ORDER — FAMOTIDINE 20 MG PO TABS
20.0000 mg | ORAL_TABLET | Freq: Once | ORAL | Status: AC
  Administered 2018-09-04: 20 mg via ORAL

## 2018-09-04 MED ORDER — ROCURONIUM BROMIDE 100 MG/10ML IV SOLN
INTRAVENOUS | Status: DC | PRN
  Administered 2018-09-04: 5 mg via INTRAVENOUS

## 2018-09-04 MED ORDER — CEFAZOLIN SODIUM-DEXTROSE 2-4 GM/100ML-% IV SOLN
INTRAVENOUS | Status: AC
  Filled 2018-09-04: qty 100

## 2018-09-04 MED ORDER — FENTANYL CITRATE (PF) 100 MCG/2ML IJ SOLN
INTRAMUSCULAR | Status: DC | PRN
  Administered 2018-09-04: 50 ug via INTRAVENOUS
  Administered 2018-09-04 (×2): 25 ug via INTRAVENOUS

## 2018-09-04 MED ORDER — FENTANYL CITRATE (PF) 100 MCG/2ML IJ SOLN
25.0000 ug | INTRAMUSCULAR | Status: DC | PRN
  Administered 2018-09-04: 50 ug via INTRAVENOUS

## 2018-09-04 MED ORDER — OXYCODONE HCL 5 MG/5ML PO SOLN: 5.0000 mg | Freq: Once | ORAL | Status: DC | PRN

## 2018-09-04 MED ORDER — BUPIVACAINE-EPINEPHRINE (PF) 0.5% -1:200000 IJ SOLN
INTRAMUSCULAR | Status: AC
  Filled 2018-09-04: qty 30

## 2018-09-04 MED ORDER — PHENYLEPHRINE HCL 10 MG/ML IJ SOLN
INTRAMUSCULAR | Status: AC
  Filled 2018-09-04: qty 1

## 2018-09-04 MED ORDER — SUCCINYLCHOLINE CHLORIDE 20 MG/ML IJ SOLN
INTRAMUSCULAR | Status: AC
  Filled 2018-09-04: qty 1

## 2018-09-04 MED ORDER — ACETAMINOPHEN 10 MG/ML IV SOLN
INTRAVENOUS | Status: AC
  Filled 2018-09-04: qty 100

## 2018-09-04 MED ORDER — BUPIVACAINE HCL (PF) 0.5 % IJ SOLN
INTRAMUSCULAR | Status: AC
  Filled 2018-09-04: qty 30

## 2018-09-04 SURGICAL SUPPLY — 32 items
BULB RESERV EVAC DRAIN JP 100C (MISCELLANEOUS) ×1 IMPLANT
CHLORAPREP W/TINT 26ML (MISCELLANEOUS) ×2 IMPLANT
COVER WAND RF STERILE (DRAPES) ×1 IMPLANT
DERMABOND ADVANCED (GAUZE/BANDAGES/DRESSINGS) ×1
DERMABOND ADVANCED .7 DNX12 (GAUZE/BANDAGES/DRESSINGS) ×1 IMPLANT
DRAIN CHANNEL JP 15F RND 16 (MISCELLANEOUS) ×1 IMPLANT
DRAPE LAP WINGED 104X35X124 (DRAPES) ×1 IMPLANT
DRAPE LAPAROTOMY 100X77 ABD (DRAPES) ×2 IMPLANT
DRAPE SHEET LG 3/4 BI-LAMINATE (DRAPES) ×3 IMPLANT
ELECT CAUTERY BLADE 6.4 (BLADE) ×2 IMPLANT
ELECT REM PT RETURN 9FT ADLT (ELECTROSURGICAL) ×2
ELECTRODE REM PT RTRN 9FT ADLT (ELECTROSURGICAL) ×1 IMPLANT
GLOVE BIO SURGEON STRL SZ 6.5 (GLOVE) ×3 IMPLANT
GLOVE BIOGEL PI IND STRL 7.0 (GLOVE) ×1 IMPLANT
GLOVE BIOGEL PI INDICATOR 7.0 (GLOVE) ×2
GOWN STRL REUS W/ TWL LRG LVL3 (GOWN DISPOSABLE) ×2 IMPLANT
GOWN STRL REUS W/TWL LRG LVL3 (GOWN DISPOSABLE) ×2
KIT TURNOVER KIT A (KITS) ×2 IMPLANT
LABEL OR SOLS (LABEL) ×2 IMPLANT
NEEDLE HYPO 22GX1.5 SAFETY (NEEDLE) ×2 IMPLANT
NS IRRIG 1000ML POUR BTL (IV SOLUTION) ×2 IMPLANT
PACK BASIN MINOR ARMC (MISCELLANEOUS) ×2 IMPLANT
SPONGE DRAIN TRACH 4X4 STRL 2S (GAUZE/BANDAGES/DRESSINGS) ×1 IMPLANT
SUT ETHILON 3-0 FS-10 30 BLK (SUTURE) ×4
SUT MNCRL 4-0 (SUTURE) ×1
SUT MNCRL 4-0 27XMFL (SUTURE) ×1
SUT VIC AB 3-0 SH 27 (SUTURE) ×1
SUT VIC AB 3-0 SH 27X BRD (SUTURE) ×1 IMPLANT
SUTURE EHLN 3-0 FS-10 30 BLK (SUTURE) IMPLANT
SUTURE MNCRL 4-0 27XMF (SUTURE) ×1 IMPLANT
SYR 30ML LL (SYRINGE) ×2 IMPLANT
TOWEL OR 17X26 4PK STRL BLUE (TOWEL DISPOSABLE) ×1 IMPLANT

## 2018-09-04 NOTE — Anesthesia Post-op Follow-up Note (Signed)
Anesthesia QCDR form completed.        
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
## 2018-09-04 NOTE — Interval H&P Note (Signed)
History and Physical Interval Note:  09/04/2018 7:24 AM  Michelle Manning  has presented today for surgery, with the diagnosis of lipoma of back  The various methods of treatment have been discussed with the patient and family. After consideration of risks, benefits and other options for treatment, the patient has consented to  Procedure(s): EXCISION LIPOMA (Right) as a surgical intervention .  The patient's history has been reviewed, patient examined, no change in status, stable for surgery.  I have reviewed the patient's chart and labs.  Questions were answered to the patient's satisfaction.     [name] Lysle Pearl

## 2018-09-04 NOTE — Op Note (Signed)
Pre-Op Dx: right shoulder lipoma Post-Op Dx: same Anesthesia: local EBL: minimal Complications:  none apparent Specimen: right shoulder lipoma Procedure: excisional biopsy of right shoulder lipoma  Surgeon: Lysle Pearl   Description of Procedure:  Consent obtained, time out performed.  Patient placed in prone position.  Area sterilized and draped in usual position.  Abx, and SCDs given.  LMA placed.  Time out performed.  Local infused to area previously marked.  10cm incision made through dermis with 15blade and lipoma noted in subcutaneous layer down to fascia.  The  15cm x 12cm x 3cm lipoma then removed from surrounding tissue completely using electrocautery, passed off field pending pathology.  Wound hemostasis noted, blake drain placed and secured with 3-0 nylon.  Wound closed in two layer fashion with 3-0 vicryl in interrupted fashion for deep dermal layer, then running 4-0 monocryl in subcuticular fashion for epidermal layer.  Wound then dressed with dermabond and drain sponge around drain.  Pt tolerated procedure well, and transferred to PACU in stable condition. Sponge and instrument count correct at end of procedure.
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
## 2018-09-04 NOTE — Transfer of Care (Signed)
Immediate Anesthesia Transfer of Care Note  Patient: Michelle Manning  Procedure(s) Performed: Procedure(s): EXCISION LIPOMA (Right)  Patient Location: PACU  Anesthesia Type:General  Level of Consciousness: sedated  Airway & Oxygen Therapy: Patient Spontanous Breathing and Patient connected to face mask oxygen  Post-op Assessment: Report given to RN and Post -op Vital signs reviewed and stable  Post vital signs: Reviewed and stable  Last Vitals:  Vitals:   09/04/18 0603 09/04/18 0902  BP: (!) 146/80 128/70  Pulse: 84 94  Resp: 16 15  Temp: 37.1 C (!) 36.2 C  SpO2: 179% 217%    Complications: No apparent anesthesia complications

## 2018-09-04 NOTE — Discharge Instructions (Signed)
  AMBULATORY SURGERY  DISCHARGE INSTRUCTIONS   1) The drugs that you were given will stay in your system until tomorrow so for the next 24 hours you should not:  A) Drive an automobile B) Make any legal decisions C) Drink any alcoholic beverage   2) You may resume regular meals tomorrow.  Today it is better to start with liquids and gradually work up to solid foods.  You may eat anything you prefer, but it is better to start with liquids, then soup and crackers, and gradually work up to solid foods.   3) Please notify your doctor immediately if you have any unusual bleeding, trouble breathing, redness and pain at the surgery site, drainage, fever, or pain not relieved by medication.    4) Additional Instructions: TAKE A STOOL SOFTENER TWICE A DAY WHILE TAKING NARCOTIC PAIN MEDICINE TO PREVENT CONSTIPATION   Please contact your physician with any problems or Same Day Surgery at [phone], Monday through Friday 6 am to 4 pm, or [place] [facility] [place] Main number at [phone].   

## 2018-09-04 NOTE — Anesthesia Postprocedure Evaluation (Signed)
Anesthesia Post Note  Patient: MARLIA SCHEWE  Procedure(s) Performed: EXCISION LIPOMA (Right )  Patient location during evaluation: PACU Anesthesia Type: General Level of consciousness: awake and alert Pain management: pain level controlled Vital Signs Assessment: post-procedure vital signs reviewed and stable Respiratory status: spontaneous breathing, nonlabored ventilation and respiratory function stable Cardiovascular status: blood pressure returned to baseline and stable Postop Assessment: no apparent nausea or vomiting Anesthetic complications: no Comments: Pt reports fell on L shoulder PTA, was sore this AM before coming to hospital. Now complains that "still sore" after prone procedure (pt rolled on L side during surgery). No sweating, cardiac or pulmonary C/O. Will manage w oral pain meds.     Last Vitals:  Vitals:   09/04/18 0936 09/04/18 0947  BP:  107/64  Pulse: 77 79  Resp: 11 14  Temp:  (!) 36.2 C  SpO2: 98% 95%    Last Pain:  Vitals:   09/04/18 0947  TempSrc:   PainSc: Asleep                 Alphonsus Sias

## 2018-09-04 NOTE — Anesthesia Procedure Notes (Signed)
Procedure Name: Intubation Date/Time: 09/04/2018 7:40 AM Performed by: Doreen Salvage, CRNA Pre-anesthesia Checklist: Patient identified, Patient being monitored, Timeout performed, Emergency Drugs available and Suction available Patient Re-evaluated:Patient Re-evaluated prior to induction Oxygen Delivery Method: Circle system utilized Preoxygenation: Pre-oxygenation with 100% oxygen Induction Type: IV induction Ventilation: Mask ventilation without difficulty Laryngoscope Size: Mac and 3 Grade View: Grade I Tube type: Oral Tube size: 7.0 mm Number of attempts: 1 Airway Equipment and Method: Stylet Placement Confirmation: ETT inserted through vocal cords under direct vision,  positive ETCO2 and breath sounds checked- equal and bilateral Secured at: 21 cm Tube secured with: Tape Dental Injury: Teeth and Oropharynx as per pre-operative assessment

## 2018-09-05 LAB — SURGICAL PATHOLOGY

## 2019-01-14 ENCOUNTER — Other Ambulatory Visit: Payer: Self-pay | Admitting: Family Medicine

## 2019-01-14 DIAGNOSIS — Z1231 Encounter for screening mammogram for malignant neoplasm of breast: Secondary | ICD-10-CM

## 2019-02-15 ENCOUNTER — Other Ambulatory Visit: Payer: Self-pay

## 2019-02-15 ENCOUNTER — Encounter: Payer: Self-pay | Admitting: Emergency Medicine

## 2019-02-15 ENCOUNTER — Emergency Department: Payer: Medicare HMO

## 2019-02-15 ENCOUNTER — Emergency Department
Admission: EM | Admit: 2019-02-15 | Discharge: 2019-02-15 | Disposition: A | Discharge status: Home / Self Care | Payer: Medicare HMO | Attending: Student in an Organized Health Care Education/Training Program | Admitting: Student in an Organized Health Care Education/Training Program

## 2019-02-15 DIAGNOSIS — Z923 Personal history of irradiation: Secondary | ICD-10-CM | POA: Insufficient documentation

## 2019-02-15 DIAGNOSIS — Z9221 Personal history of antineoplastic chemotherapy: Secondary | ICD-10-CM | POA: Insufficient documentation

## 2019-02-15 DIAGNOSIS — N3090 Cystitis, unspecified without hematuria: Secondary | ICD-10-CM | POA: Diagnosis not present

## 2019-02-15 DIAGNOSIS — I10 Essential (primary) hypertension: Secondary | ICD-10-CM | POA: Diagnosis not present

## 2019-02-15 DIAGNOSIS — R42 Dizziness and giddiness: Secondary | ICD-10-CM | POA: Diagnosis present

## 2019-02-15 DIAGNOSIS — E876 Hypokalemia: Secondary | ICD-10-CM | POA: Diagnosis not present

## 2019-02-15 DIAGNOSIS — Z91010 Allergy to peanuts: Secondary | ICD-10-CM | POA: Insufficient documentation

## 2019-02-15 DIAGNOSIS — Z79899 Other long term (current) drug therapy: Secondary | ICD-10-CM | POA: Diagnosis not present

## 2019-02-15 DIAGNOSIS — Z7982 Long term (current) use of aspirin: Secondary | ICD-10-CM | POA: Insufficient documentation

## 2019-02-15 DIAGNOSIS — Z8673 Personal history of transient ischemic attack (TIA), and cerebral infarction without residual deficits: Secondary | ICD-10-CM | POA: Insufficient documentation

## 2019-02-15 DIAGNOSIS — Z853 Personal history of malignant neoplasm of breast: Secondary | ICD-10-CM | POA: Insufficient documentation

## 2019-02-15 LAB — URINALYSIS, COMPLETE (UACMP) WITH MICROSCOPIC
Bilirubin Urine: NEGATIVE
Glucose, UA: NEGATIVE mg/dL
Hgb urine dipstick: NEGATIVE
Ketones, ur: 5 mg/dL — AB
Nitrite: NEGATIVE
Protein, ur: NEGATIVE mg/dL
Specific Gravity, Urine: 1.02 (ref 1.005–1.030)
pH: 6 (ref 5.0–8.0)

## 2019-02-15 LAB — CBC
HCT: 38.7 % (ref 36.0–46.0)
Hemoglobin: 12.5 g/dL (ref 12.0–15.0)
MCH: 28.2 pg (ref 26.0–34.0)
MCHC: 32.3 g/dL (ref 30.0–36.0)
MCV: 87.2 fL (ref 80.0–100.0)
Platelets: 275 10*3/uL (ref 150–400)
RBC: 4.44 MIL/uL (ref 3.87–5.11)
RDW: 13.7 % (ref 11.5–15.5)
WBC: 9.9 10*3/uL (ref 4.0–10.5)
nRBC: 0 % (ref 0.0–0.2)

## 2019-02-15 LAB — BASIC METABOLIC PANEL
Anion gap: 12 (ref 5–15)
BUN: 17 mg/dL (ref 8–23)
CO2: 26 mmol/L (ref 22–32)
Calcium: 9.3 mg/dL (ref 8.9–10.3)
Chloride: 102 mmol/L (ref 98–111)
Creatinine, Ser: 0.65 mg/dL (ref 0.44–1.00)
GFR calc Af Amer: >60 mL/min (ref >60)
GFR calc non Af Amer: >60 mL/min (ref >60)
Glucose, Bld: 132 mg/dL — ABNORMAL HIGH (ref 70–99)
Potassium: 3 mmol/L — ABNORMAL LOW (ref 3.5–5.1)
Sodium: 140 mmol/L (ref 135–145)

## 2019-02-15 MED ORDER — POTASSIUM CHLORIDE ER 10 MEQ PO TBCR: 10.0000 meq | EXTENDED_RELEASE_TABLET | Freq: Every day | ORAL | 0 refills | Status: DC

## 2019-02-15 MED ORDER — SODIUM CHLORIDE 0.9% FLUSH: 3.0000 mL | Freq: Once | INTRAVENOUS | Status: DC

## 2019-02-15 MED ORDER — CEPHALEXIN 500 MG PO CAPS: 500.0000 mg | ORAL_CAPSULE | Freq: Two times a day (BID) | ORAL | 0 refills | Status: AC

## 2019-02-15 MED ORDER — POTASSIUM CHLORIDE CRYS ER 20 MEQ PO TBCR
40.0000 meq | EXTENDED_RELEASE_TABLET | Freq: Once | ORAL | Status: AC
  Administered 2019-02-15: 15:00:00 40 meq via ORAL
  Filled 2019-02-15: qty 2

## 2019-02-15 MED ORDER — CEPHALEXIN 500 MG PO CAPS
500.0000 mg | ORAL_CAPSULE | Freq: Once | ORAL | Status: AC
  Administered 2019-02-15: 500 mg via ORAL
  Filled 2019-02-15: qty 1

## 2019-02-15 MED ORDER — MECLIZINE HCL 25 MG PO TABS
25.0000 mg | ORAL_TABLET | Freq: Once | ORAL | Status: AC
  Administered 2019-02-15: 15:00:00 25 mg via ORAL
  Filled 2019-02-15: qty 1

## 2019-02-15 MED ORDER — MECLIZINE HCL 12.5 MG PO TABS: 12.5000 mg | ORAL_TABLET | Freq: Three times a day (TID) | ORAL | 0 refills | Status: DC | PRN

## 2019-02-15 NOTE — ED Notes (Signed)
Pt ambulated well with minimal assistance to bathroom. No reports of dizziness while sitting, standing, or walking. Pt states that she already feels much better than she did before she came.
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
## 2019-02-15 NOTE — Discharge Instructions (Addendum)
Your MRI in the emergency department of your brain was reassuring.  Please take meclizine as needed up to 3 times per day for dizziness.  Do not drive after taking medication.  Your urine shows that you may be starting to get a urinary tract infection.  Please begin Keflex to cover you for an infection.  Your potassium was also a little low in the emergency department.  I have given you a couple of days worth of a potassium prescription.  Please call primary care tomorrow morning for an appointment this week to have your dizziness, potassium and urine rechecked.  I have also given you a referral for ENT for dizziness.

## 2019-02-15 NOTE — ED Notes (Signed)
Patient transported to MRI 

## 2019-02-15 NOTE — ED Triage Notes (Signed)
Pt to ED via POV c/o dizziness and nausea. Pt states that this started around 11 pm last night. Pt states that she feels like the room is spinning and if she does not hold on to something she is going to fall in the floor. Pt states that dizziness is worse when she turns her head. Pt has hx/o vertigo 15-20 years ago but states that this does not feel similar. Pt is in NAD at this time.

## 2019-02-17 LAB — URINE CULTURE: Culture: >=100000 — AB

## 2019-03-09 ENCOUNTER — Other Ambulatory Visit: Payer: Self-pay

## 2019-03-09 ENCOUNTER — Ambulatory Visit
Admission: RE | Admit: 2019-03-09 | Discharge: 2019-03-09 | Disposition: A | Discharge status: Home / Self Care | Payer: Medicare HMO | Source: Ambulatory Visit | Attending: Family Medicine | Admitting: Family Medicine

## 2019-03-09 DIAGNOSIS — Z1231 Encounter for screening mammogram for malignant neoplasm of breast: Secondary | ICD-10-CM

## 2019-11-23 ENCOUNTER — Emergency Department
Admission: EM | Admit: 2019-11-23 | Discharge: 2019-11-23 | Disposition: A | Discharge status: Home / Self Care | Payer: Medicare HMO | Attending: Emergency Medicine | Admitting: Emergency Medicine

## 2019-11-23 ENCOUNTER — Other Ambulatory Visit: Payer: Self-pay

## 2019-11-23 ENCOUNTER — Encounter: Payer: Self-pay | Admitting: Emergency Medicine

## 2019-11-23 DIAGNOSIS — Z853 Personal history of malignant neoplasm of breast: Secondary | ICD-10-CM | POA: Diagnosis not present

## 2019-11-23 DIAGNOSIS — K6289 Other specified diseases of anus and rectum: Secondary | ICD-10-CM | POA: Insufficient documentation

## 2019-11-23 DIAGNOSIS — Z923 Personal history of irradiation: Secondary | ICD-10-CM | POA: Insufficient documentation

## 2019-11-23 DIAGNOSIS — Z9221 Personal history of antineoplastic chemotherapy: Secondary | ICD-10-CM | POA: Insufficient documentation

## 2019-11-23 DIAGNOSIS — Z79899 Other long term (current) drug therapy: Secondary | ICD-10-CM | POA: Diagnosis not present

## 2019-11-23 DIAGNOSIS — I10 Essential (primary) hypertension: Secondary | ICD-10-CM | POA: Insufficient documentation

## 2019-11-23 DIAGNOSIS — Z91010 Allergy to peanuts: Secondary | ICD-10-CM | POA: Diagnosis not present

## 2019-11-23 DIAGNOSIS — Z7982 Long term (current) use of aspirin: Secondary | ICD-10-CM | POA: Insufficient documentation

## 2019-11-23 LAB — COMPREHENSIVE METABOLIC PANEL
ALT: 14 U/L (ref 0–44)
AST: 21 U/L (ref 15–41)
Albumin: 4.2 g/dL (ref 3.5–5.0)
Alkaline Phosphatase: 54 U/L (ref 38–126)
Anion gap: 11 (ref 5–15)
BUN: 11 mg/dL (ref 8–23)
CO2: 27 mmol/L (ref 22–32)
Calcium: 9.1 mg/dL (ref 8.9–10.3)
Chloride: 102 mmol/L (ref 98–111)
Creatinine, Ser: 0.85 mg/dL (ref 0.44–1.00)
GFR calc Af Amer: >60 mL/min (ref >60)
GFR calc non Af Amer: >60 mL/min (ref >60)
Glucose, Bld: 129 mg/dL — ABNORMAL HIGH (ref 70–99)
Potassium: 3.2 mmol/L — ABNORMAL LOW (ref 3.5–5.1)
Sodium: 140 mmol/L (ref 135–145)
Total Bilirubin: 0.7 mg/dL (ref 0.3–1.2)
Total Protein: 7.6 g/dL (ref 6.5–8.1)

## 2019-11-23 LAB — CBC
HCT: 39.9 % (ref 36.0–46.0)
Hemoglobin: 12.7 g/dL (ref 12.0–15.0)
MCH: 28 pg (ref 26.0–34.0)
MCHC: 31.8 g/dL (ref 30.0–36.0)
MCV: 87.9 fL (ref 80.0–100.0)
Platelets: 290 10*3/uL (ref 150–400)
RBC: 4.54 MIL/uL (ref 3.87–5.11)
RDW: 13.5 % (ref 11.5–15.5)
WBC: 5.8 10*3/uL (ref 4.0–10.5)
nRBC: 0 % (ref 0.0–0.2)

## 2019-11-23 MED ORDER — HYDROCORTISONE ACETATE 25 MG RE SUPP: 25.0000 mg | Freq: Two times a day (BID) | RECTAL | 1 refills | Status: DC

## 2019-11-23 NOTE — Discharge Instructions (Addendum)
As we discussed it is important that you follow up with the GI doctor who performed your colonoscopy. Please seek medical attention for any high fevers, chest pain, shortness of breath, change in behavior, persistent vomiting, bloody stool or any other new or concerning symptoms.
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
## 2019-11-23 NOTE — ED Notes (Signed)
C/o rectal pain since colonoscopy last month.  States pain worse when pressure is applied to area, ie:  Sitting down.

## 2019-11-23 NOTE — ED Triage Notes (Signed)
Pt reports pain gets worse with passing gas and having BM.

## 2019-11-23 NOTE — ED Triage Notes (Signed)
Pt reports had a colonoscopy on January 12th. Pt reports when she left, her bowels let down on the way home and she started having pain to her rectum. Pt reports that she called his office and he was supposed to call her back but no one has. Pt reports pain is so sever she can not even sit down right. Pt denies bleeding but reports pain is severe.

## 2019-11-23 NOTE — ED Notes (Signed)
[place] sent to lab

## 2020-01-11 ENCOUNTER — Other Ambulatory Visit: Payer: Self-pay | Admitting: Student

## 2020-01-11 DIAGNOSIS — Z7722 Contact with and (suspected) exposure to environmental tobacco smoke (acute) (chronic): Secondary | ICD-10-CM

## 2020-01-11 DIAGNOSIS — R634 Abnormal weight loss: Secondary | ICD-10-CM

## 2020-01-11 DIAGNOSIS — R630 Anorexia: Secondary | ICD-10-CM

## 2020-01-14 ENCOUNTER — Other Ambulatory Visit: Payer: Self-pay | Admitting: Student

## 2020-01-14 DIAGNOSIS — Z7722 Contact with and (suspected) exposure to environmental tobacco smoke (acute) (chronic): Secondary | ICD-10-CM

## 2020-01-14 DIAGNOSIS — R630 Anorexia: Secondary | ICD-10-CM

## 2020-01-14 DIAGNOSIS — R634 Abnormal weight loss: Secondary | ICD-10-CM

## 2020-01-27 ENCOUNTER — Emergency Department
Admission: EM | Admit: 2020-01-27 | Discharge: 2020-01-27 | Disposition: A | Discharge status: Home / Self Care | Payer: Medicare HMO | Attending: Student in an Organized Health Care Education/Training Program | Admitting: Student in an Organized Health Care Education/Training Program

## 2020-01-27 ENCOUNTER — Other Ambulatory Visit: Payer: Self-pay

## 2020-01-27 ENCOUNTER — Emergency Department: Payer: Medicare HMO

## 2020-01-27 ENCOUNTER — Ambulatory Visit: Admission: RE | Admit: 2020-01-27 | Payer: Medicare HMO | Source: Ambulatory Visit

## 2020-01-27 DIAGNOSIS — R109 Unspecified abdominal pain: Secondary | ICD-10-CM | POA: Diagnosis present

## 2020-01-27 DIAGNOSIS — Z8673 Personal history of transient ischemic attack (TIA), and cerebral infarction without residual deficits: Secondary | ICD-10-CM | POA: Insufficient documentation

## 2020-01-27 DIAGNOSIS — I10 Essential (primary) hypertension: Secondary | ICD-10-CM | POA: Diagnosis not present

## 2020-01-27 DIAGNOSIS — R1084 Generalized abdominal pain: Secondary | ICD-10-CM

## 2020-01-27 DIAGNOSIS — Z853 Personal history of malignant neoplasm of breast: Secondary | ICD-10-CM | POA: Diagnosis not present

## 2020-01-27 DIAGNOSIS — Z79899 Other long term (current) drug therapy: Secondary | ICD-10-CM | POA: Insufficient documentation

## 2020-01-27 DIAGNOSIS — R634 Abnormal weight loss: Secondary | ICD-10-CM

## 2020-01-27 DIAGNOSIS — Z7982 Long term (current) use of aspirin: Secondary | ICD-10-CM | POA: Diagnosis not present

## 2020-01-27 LAB — COMPREHENSIVE METABOLIC PANEL
ALT: 18 U/L (ref 0–44)
AST: 20 U/L (ref 15–41)
Albumin: 4.6 g/dL (ref 3.5–5.0)
Alkaline Phosphatase: 55 U/L (ref 38–126)
Anion gap: 12 (ref 5–15)
BUN: 12 mg/dL (ref 8–23)
CO2: 27 mmol/L (ref 22–32)
Calcium: 9.9 mg/dL (ref 8.9–10.3)
Chloride: 101 mmol/L (ref 98–111)
Creatinine, Ser: 0.78 mg/dL (ref 0.44–1.00)
GFR calc Af Amer: >60 mL/min (ref >60)
GFR calc non Af Amer: >60 mL/min (ref >60)
Glucose, Bld: 133 mg/dL — ABNORMAL HIGH (ref 70–99)
Potassium: 3.3 mmol/L — ABNORMAL LOW (ref 3.5–5.1)
Sodium: 140 mmol/L (ref 135–145)
Total Bilirubin: 0.8 mg/dL (ref 0.3–1.2)
Total Protein: 8.1 g/dL (ref 6.5–8.1)

## 2020-01-27 LAB — CBC
HCT: 40.8 % (ref 36.0–46.0)
Hemoglobin: 13.2 g/dL (ref 12.0–15.0)
MCH: 28.6 pg (ref 26.0–34.0)
MCHC: 32.4 g/dL (ref 30.0–36.0)
MCV: 88.5 fL (ref 80.0–100.0)
Platelets: 266 10*3/uL (ref 150–400)
RBC: 4.61 MIL/uL (ref 3.87–5.11)
RDW: 13.6 % (ref 11.5–15.5)
WBC: 8.9 10*3/uL (ref 4.0–10.5)
nRBC: 0 % (ref 0.0–0.2)

## 2020-01-27 LAB — URINALYSIS, COMPLETE (UACMP) WITH MICROSCOPIC
Bacteria, UA: NONE SEEN
Bilirubin Urine: NEGATIVE
Glucose, UA: NEGATIVE mg/dL
Hgb urine dipstick: NEGATIVE
Ketones, ur: NEGATIVE mg/dL
Leukocytes,Ua: NEGATIVE
Nitrite: NEGATIVE
Protein, ur: NEGATIVE mg/dL
Specific Gravity, Urine: 1.004 — ABNORMAL LOW (ref 1.005–1.030)
Squamous Epithelial / HPF: NONE SEEN (ref 0–5)
pH: 6 (ref 5.0–8.0)

## 2020-01-27 LAB — LIPASE, BLOOD: Lipase: 32 U/L (ref 11–51)

## 2020-01-27 NOTE — ED Triage Notes (Addendum)
Pt arrives via pov from home with c/o upper abd pain. Pt states " I have worms". Pt denies seeing any parasites in stool. Pt report report losing around 60lbs in last 2-3 months without dieting. Pt states weight before eating and then a couple hours after waiting and states she had lost 4lbs, without having a bowel movement. NAD noted at this time. Pt denies any NVD.

## 2020-01-27 NOTE — Discharge Instructions (Signed)
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
## 2020-02-01 ENCOUNTER — Other Ambulatory Visit: Payer: Self-pay | Admitting: Family Medicine

## 2020-02-01 DIAGNOSIS — Z1231 Encounter for screening mammogram for malignant neoplasm of breast: Secondary | ICD-10-CM

## 2020-03-14 ENCOUNTER — Other Ambulatory Visit: Payer: Self-pay

## 2020-03-14 ENCOUNTER — Ambulatory Visit
Admission: RE | Admit: 2020-03-14 | Discharge: 2020-03-14 | Disposition: A | Discharge status: Home / Self Care | Payer: Medicare HMO | Source: Ambulatory Visit | Attending: Family Medicine | Admitting: Family Medicine

## 2020-03-14 DIAGNOSIS — Z1231 Encounter for screening mammogram for malignant neoplasm of breast: Secondary | ICD-10-CM | POA: Diagnosis present

## 2020-03-16 ENCOUNTER — Encounter: Payer: Self-pay | Admitting: Family Medicine

## 2020-04-18 ENCOUNTER — Other Ambulatory Visit: Payer: Self-pay

## 2020-04-20 ENCOUNTER — Encounter: Payer: Self-pay | Admitting: Gastroenterology

## 2020-04-20 ENCOUNTER — Other Ambulatory Visit: Payer: Self-pay

## 2020-04-20 ENCOUNTER — Ambulatory Visit: Payer: Medicare HMO | Admitting: Gastroenterology

## 2020-04-20 VITALS — BP 158/105 | HR 105 | Temp 98.5°F | Ht 68.0 in | Wt 187.4 lb

## 2020-04-20 DIAGNOSIS — K921 Melena: Secondary | ICD-10-CM | POA: Diagnosis not present

## 2020-04-20 DIAGNOSIS — Z8619 Personal history of other infectious and parasitic diseases: Secondary | ICD-10-CM | POA: Diagnosis not present
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
## 2020-04-26 ENCOUNTER — Other Ambulatory Visit
Admission: RE | Admit: 2020-04-26 | Discharge: 2020-04-26 | Disposition: A | Discharge status: Home / Self Care | Payer: Medicare HMO | Source: Ambulatory Visit | Attending: Gastroenterology | Admitting: Gastroenterology

## 2020-04-26 ENCOUNTER — Other Ambulatory Visit: Payer: Self-pay

## 2020-04-26 DIAGNOSIS — Z01812 Encounter for preprocedural laboratory examination: Secondary | ICD-10-CM | POA: Diagnosis present

## 2020-04-26 DIAGNOSIS — Z20822 Contact with and (suspected) exposure to covid-19: Secondary | ICD-10-CM | POA: Diagnosis not present

## 2020-04-26 LAB — SARS CORONAVIRUS 2 (TAT 6-24 HRS): SARS Coronavirus 2: NEGATIVE

## 2020-04-27 ENCOUNTER — Encounter: Payer: Self-pay | Admitting: Gastroenterology

## 2020-04-27 ENCOUNTER — Inpatient Hospital Stay: Payer: Medicare HMO

## 2020-04-27 ENCOUNTER — Inpatient Hospital Stay: Payer: Medicare HMO | Admitting: Oncology

## 2020-04-28 ENCOUNTER — Encounter
Admission: RE | Disposition: A | Discharge status: Home / Self Care | Payer: Self-pay | Source: Home / Self Care | Attending: Gastroenterology

## 2020-04-28 ENCOUNTER — Ambulatory Visit: Payer: Medicare HMO | Admitting: Anesthesiology

## 2020-04-28 ENCOUNTER — Other Ambulatory Visit: Payer: Self-pay

## 2020-04-28 ENCOUNTER — Encounter: Payer: Self-pay | Admitting: Gastroenterology

## 2020-04-28 ENCOUNTER — Ambulatory Visit
Admission: RE | Admit: 2020-04-28 | Discharge: 2020-04-28 | Disposition: A | Discharge status: Home / Self Care | Payer: Medicare HMO | Attending: Gastroenterology | Admitting: Gastroenterology

## 2020-04-28 DIAGNOSIS — Z9221 Personal history of antineoplastic chemotherapy: Secondary | ICD-10-CM | POA: Insufficient documentation

## 2020-04-28 DIAGNOSIS — Z8619 Personal history of other infectious and parasitic diseases: Secondary | ICD-10-CM | POA: Diagnosis not present

## 2020-04-28 DIAGNOSIS — Z8673 Personal history of transient ischemic attack (TIA), and cerebral infarction without residual deficits: Secondary | ICD-10-CM | POA: Diagnosis not present

## 2020-04-28 DIAGNOSIS — K921 Melena: Secondary | ICD-10-CM | POA: Diagnosis present

## 2020-04-28 DIAGNOSIS — Z923 Personal history of irradiation: Secondary | ICD-10-CM | POA: Diagnosis not present

## 2020-04-28 DIAGNOSIS — K3189 Other diseases of stomach and duodenum: Secondary | ICD-10-CM

## 2020-04-28 DIAGNOSIS — Z853 Personal history of malignant neoplasm of breast: Secondary | ICD-10-CM | POA: Insufficient documentation

## 2020-04-28 DIAGNOSIS — Z79899 Other long term (current) drug therapy: Secondary | ICD-10-CM | POA: Diagnosis not present

## 2020-04-28 DIAGNOSIS — K2950 Unspecified chronic gastritis without bleeding: Secondary | ICD-10-CM | POA: Insufficient documentation

## 2020-04-28 DIAGNOSIS — I10 Essential (primary) hypertension: Secondary | ICD-10-CM | POA: Diagnosis not present

## 2020-04-28 HISTORY — PX: ESOPHAGOGASTRODUODENOSCOPY (EGD) WITH PROPOFOL: SHX5813

## 2020-04-28 SURGERY — ESOPHAGOGASTRODUODENOSCOPY (EGD) WITH PROPOFOL
Anesthesia: General

## 2020-04-28 MED ORDER — PROPOFOL 500 MG/50ML IV EMUL
INTRAVENOUS | Status: AC
  Filled 2020-04-28: qty 50

## 2020-04-28 MED ORDER — LIDOCAINE HCL (CARDIAC) PF 100 MG/5ML IV SOSY
PREFILLED_SYRINGE | INTRAVENOUS | Status: DC | PRN
  Administered 2020-04-28: 60 mg via INTRAVENOUS

## 2020-04-28 MED ORDER — SODIUM CHLORIDE 0.9 % IV SOLN: INTRAVENOUS | Status: DC

## 2020-04-28 MED ORDER — LIDOCAINE HCL (PF) 2 % IJ SOLN
INTRAMUSCULAR | Status: AC
  Filled 2020-04-28: qty 5

## 2020-04-28 MED ORDER — PROPOFOL 500 MG/50ML IV EMUL
INTRAVENOUS | Status: DC | PRN
  Administered 2020-04-28: 120 ug/kg/min via INTRAVENOUS

## 2020-04-28 NOTE — Op Note (Signed)
[facility] Gastroenterology Patient Name: Michelle Manning Procedure Date: 04/28/2020 8:58 AM MRN: [id number] Account #: [id number] Date of Birth: [birth year] Admit Type: Outpatient Age: 74 Room: [facility] ENDO ROOM 3 Gender: Female Note Status: Finalized Procedure:             Upper GI endoscopy Indications:           Melena Providers:             Zayanna Pundt B. Bonna Gains MD, MD Referring MD:          Forest Gleason Md, MD (Referring MD) Medicines:             Monitored Anesthesia Care Complications:         No immediate complications. Procedure:             Pre-Anesthesia Assessment:                        - Prior to the procedure, a History and Physical was                         performed, and patient medications, allergies and                         sensitivities were reviewed. The patient's tolerance                         of previous anesthesia was reviewed.                        - The risks and benefits of the procedure and the                         sedation options and risks were discussed with the                         patient. All questions were answered and informed                         consent was obtained.                        - Patient identification and proposed procedure were                         verified prior to the procedure by the physician, the                         nurse, the anesthesiologist, the anesthetist and the                         technician. The procedure was verified in the                         procedure room.                        - ASA Grade Assessment: II - A patient with mild                         systemic disease.  After obtaining informed consent, the endoscope was                         passed under direct vision. Throughout the procedure,                         the patient's blood pressure, pulse, and oxygen                         saturations were monitored continuously. The Endoscope                          was introduced through the mouth, and advanced to the                         third part of duodenum. The upper GI endoscopy was                         accomplished with ease. The patient tolerated the                         procedure well. Findings:      The examined esophagus was normal.      Patchy mildly erythematous mucosa without bleeding was found in the       gastric antrum. Biopsies were taken with a cold forceps for histology.       Biopsies were obtained in the gastric body, at the incisura and in the       gastric antrum with cold forceps for histology.      The exam of the stomach was otherwise normal.      The duodenal bulb, second portion of the duodenum and examined duodenum       were normal. Impression:            - Normal esophagus.                        - Erythematous mucosa in the antrum. Biopsied.                        - Normal duodenal bulb, second portion of the duodenum                         and examined duodenum.                        - Biopsies were obtained in the gastric body, at the                         incisura and in the gastric antrum. Recommendation:        - Await pathology results.                        - Discharge patient to home (with escort).                        - Advance diet as tolerated.                        - Continue present medications.                        -  Patient has a contact number available for                         emergencies. The signs and symptoms of potential                         delayed complications were discussed with the patient.                         Return to normal activities tomorrow. Written                         discharge instructions were provided to the patient.                        - Discharge patient to home (with escort).                        - The findings and recommendations were discussed with                         the patient.                        - The  findings and recommendations were discussed with                         the patient's family. Procedure Code(s):     --- Professional ---                        [phone], Esophagogastroduodenoscopy, flexible,                         transoral; with biopsy, single or multiple Diagnosis Code(s):     --- Professional ---                        K31.89, Other diseases of stomach and duodenum                        K92.1, Melena (includes Hematochezia) CPT copyright 2019 American Medical Association. All rights reserved. The codes documented in this report are preliminary and upon coder review may  be revised to meet current compliance requirements.  [name], MD Margretta Sidle B. Bonna Gains MD, MD 04/28/2020 9:15:59 AM This report has been signed electronically. Number of Addenda: 0 Note Initiated On: 04/28/2020 8:58 AM      [facility]
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
## 2020-04-28 NOTE — Transfer of Care (Signed)
Immediate Anesthesia Transfer of Care Note  Patient: TYRIANNA LIGHTLE  Procedure(s) Performed: ESOPHAGOGASTRODUODENOSCOPY (EGD) WITH PROPOFOL (N/A )  Patient Location: PACU  Anesthesia Type:General  Level of Consciousness: awake and sedated  Airway & Oxygen Therapy: Patient Spontanous Breathing and Patient connected to nasal cannula oxygen  Post-op Assessment: Report given to RN and Post -op Vital signs reviewed and stable  Post vital signs: Reviewed and stable  Last Vitals:  Vitals Value Taken Time  BP 143/80 04/28/20 0917  Temp    Pulse 75 04/28/20 0917  Resp 22 04/28/20 0917  SpO2 97 % 04/28/20 0917  Vitals shown include unvalidated device data.  Last Pain:  Vitals:   04/28/20 0806  TempSrc: Oral  PainSc: 0-No pain         Complications: No complications documented.

## 2020-04-28 NOTE — Anesthesia Procedure Notes (Signed)
Performed by: Cook-Martin, Osmin Welz Pre-anesthesia Checklist: Patient identified, Emergency Drugs available, Suction available, Patient being monitored and Timeout performed Patient Re-evaluated:Patient Re-evaluated prior to induction Oxygen Delivery Method: Nasal cannula Preoxygenation: Pre-oxygenation with 100% oxygen Induction Type: IV induction Airway Equipment and Method: Bite block Placement Confirmation: CO2 detector and positive ETCO2       

## 2020-04-28 NOTE — Anesthesia Postprocedure Evaluation (Signed)
Anesthesia Post Note  Patient: Michelle Manning  Procedure(s) Performed: ESOPHAGOGASTRODUODENOSCOPY (EGD) WITH PROPOFOL (N/A )  Patient location during evaluation: Endoscopy Anesthesia Type: General Level of consciousness: awake and alert Pain management: pain level controlled Vital Signs Assessment: post-procedure vital signs reviewed and stable Respiratory status: spontaneous breathing, nonlabored ventilation, respiratory function stable and patient connected to nasal cannula oxygen Cardiovascular status: blood pressure returned to baseline and stable Postop Assessment: no apparent nausea or vomiting Anesthetic complications: no   No complications documented.   Last Vitals:  Vitals:   04/28/20 0936 04/28/20 0946  BP: (!) 151/89 (!) 152/87  Pulse: 73   Resp: 17   Temp:    SpO2: 99%     Last Pain:  Vitals:   04/28/20 0946  TempSrc:   PainSc: 0-No pain                 Martha Clan
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
## 2020-05-04 LAB — SURGICAL PATHOLOGY

## 2020-05-05 ENCOUNTER — Encounter: Payer: Self-pay | Admitting: Gastroenterology

## 2020-05-05 ENCOUNTER — Inpatient Hospital Stay: Payer: Medicare HMO

## 2020-05-05 ENCOUNTER — Encounter: Payer: Self-pay | Admitting: Oncology

## 2020-05-05 ENCOUNTER — Inpatient Hospital Stay: Payer: Medicare HMO | Attending: Oncology | Admitting: Oncology

## 2020-05-05 ENCOUNTER — Other Ambulatory Visit: Payer: Self-pay

## 2020-05-05 VITALS — BP 138/73 | HR 79 | Temp 97.4°F | Resp 16 | Wt 188.6 lb

## 2020-05-05 DIAGNOSIS — Z9223 Personal history of estrogen therapy: Secondary | ICD-10-CM | POA: Diagnosis not present

## 2020-05-05 DIAGNOSIS — Z9221 Personal history of antineoplastic chemotherapy: Secondary | ICD-10-CM | POA: Insufficient documentation

## 2020-05-05 DIAGNOSIS — Z8673 Personal history of transient ischemic attack (TIA), and cerebral infarction without residual deficits: Secondary | ICD-10-CM | POA: Insufficient documentation

## 2020-05-05 DIAGNOSIS — Z79899 Other long term (current) drug therapy: Secondary | ICD-10-CM | POA: Insufficient documentation

## 2020-05-05 DIAGNOSIS — Z853 Personal history of malignant neoplasm of breast: Secondary | ICD-10-CM | POA: Insufficient documentation

## 2020-05-05 DIAGNOSIS — Z171 Estrogen receptor negative status [ER-]: Secondary | ICD-10-CM | POA: Insufficient documentation

## 2020-05-05 DIAGNOSIS — C50911 Malignant neoplasm of unspecified site of right female breast: Secondary | ICD-10-CM | POA: Diagnosis not present

## 2020-05-05 DIAGNOSIS — Z923 Personal history of irradiation: Secondary | ICD-10-CM | POA: Diagnosis not present

## 2020-05-05 DIAGNOSIS — Z170 Estrogen receptor positive status [ER+]: Secondary | ICD-10-CM | POA: Diagnosis not present

## 2020-05-05 DIAGNOSIS — I10 Essential (primary) hypertension: Secondary | ICD-10-CM | POA: Diagnosis not present

## 2020-05-05 DIAGNOSIS — Z8719 Personal history of other diseases of the digestive system: Secondary | ICD-10-CM | POA: Insufficient documentation

## 2021-03-08 ENCOUNTER — Ambulatory Visit
Admission: RE | Admit: 2021-03-08 | Discharge: 2021-03-08 | Disposition: A | Discharge status: Home / Self Care | Payer: Medicare Other | Source: Ambulatory Visit | Attending: Obstetrics and Gynecology | Admitting: Obstetrics and Gynecology

## 2021-03-08 ENCOUNTER — Other Ambulatory Visit: Payer: Self-pay | Admitting: Obstetrics and Gynecology

## 2021-03-08 DIAGNOSIS — R634 Abnormal weight loss: Secondary | ICD-10-CM | POA: Diagnosis present

## 2021-03-10 ENCOUNTER — Other Ambulatory Visit: Payer: Self-pay | Admitting: Obstetrics and Gynecology

## 2021-03-10 DIAGNOSIS — R102 Pelvic and perineal pain: Secondary | ICD-10-CM
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
## 2021-05-08 ENCOUNTER — Ambulatory Visit: Payer: Medicare HMO | Admitting: Oncology

## 2021-05-09 ENCOUNTER — Encounter: Payer: Self-pay | Admitting: Oncology

## 2021-05-09 ENCOUNTER — Inpatient Hospital Stay: Payer: Medicare HMO | Attending: Oncology | Admitting: Oncology

## 2021-05-09 ENCOUNTER — Inpatient Hospital Stay: Payer: Medicare HMO

## 2021-05-09 VITALS — BP 137/81 | HR 83 | Temp 98.7°F | Resp 16 | Ht 68.0 in | Wt 165.0 lb

## 2021-05-09 DIAGNOSIS — C50911 Malignant neoplasm of unspecified site of right female breast: Secondary | ICD-10-CM

## 2021-05-09 DIAGNOSIS — I10 Essential (primary) hypertension: Secondary | ICD-10-CM | POA: Insufficient documentation

## 2021-05-09 DIAGNOSIS — Z9221 Personal history of antineoplastic chemotherapy: Secondary | ICD-10-CM | POA: Insufficient documentation

## 2021-05-09 DIAGNOSIS — Z79899 Other long term (current) drug therapy: Secondary | ICD-10-CM | POA: Diagnosis not present

## 2021-05-09 DIAGNOSIS — Z853 Personal history of malignant neoplasm of breast: Secondary | ICD-10-CM | POA: Diagnosis present

## 2021-05-09 DIAGNOSIS — Z90710 Acquired absence of both cervix and uterus: Secondary | ICD-10-CM | POA: Insufficient documentation

## 2021-05-09 DIAGNOSIS — Z923 Personal history of irradiation: Secondary | ICD-10-CM | POA: Diagnosis not present

## 2021-05-09 LAB — CBC WITH DIFFERENTIAL/PLATELET
Abs Immature Granulocytes: 0.01 10*3/uL (ref 0.00–0.07)
Basophils Absolute: 0 10*3/uL (ref 0.0–0.1)
Basophils Relative: 1 %
Eosinophils Absolute: 0.1 10*3/uL (ref 0.0–0.5)
Eosinophils Relative: 2 %
HCT: 39 % (ref 36.0–46.0)
Hemoglobin: 12.7 g/dL (ref 12.0–15.0)
Immature Granulocytes: 0 %
Lymphocytes Relative: 26 %
Lymphs Abs: 1.3 10*3/uL (ref 0.7–4.0)
MCH: 29.8 pg (ref 26.0–34.0)
MCHC: 32.6 g/dL (ref 30.0–36.0)
MCV: 91.5 fL (ref 80.0–100.0)
Monocytes Absolute: 0.4 10*3/uL (ref 0.1–1.0)
Monocytes Relative: 8 %
Neutro Abs: 3.1 10*3/uL (ref 1.7–7.7)
Neutrophils Relative %: 63 %
Platelets: 248 10*3/uL (ref 150–400)
RBC: 4.26 MIL/uL (ref 3.87–5.11)
RDW: 13.6 % (ref 11.5–15.5)
WBC: 4.9 10*3/uL (ref 4.0–10.5)
nRBC: 0 % (ref 0.0–0.2)

## 2021-05-09 LAB — COMPREHENSIVE METABOLIC PANEL
ALT: 9 U/L (ref 0–44)
AST: 17 U/L (ref 15–41)
Albumin: 4.5 g/dL (ref 3.5–5.0)
Alkaline Phosphatase: 38 U/L (ref 38–126)
Anion gap: 7 (ref 5–15)
BUN: 9 mg/dL (ref 8–23)
CO2: 32 mmol/L (ref 22–32)
Calcium: 9.8 mg/dL (ref 8.9–10.3)
Chloride: 100 mmol/L (ref 98–111)
Creatinine, Ser: 0.71 mg/dL (ref 0.44–1.00)
GFR, Estimated: >60 mL/min (ref >60)
Glucose, Bld: 107 mg/dL — ABNORMAL HIGH (ref 70–99)
Potassium: 3.9 mmol/L (ref 3.5–5.1)
Sodium: 139 mmol/L (ref 135–145)
Total Bilirubin: 1.1 mg/dL (ref 0.3–1.2)
Total Protein: 7.6 g/dL (ref 6.5–8.1)

## 2021-05-09 NOTE — Progress Notes (Signed)
Has diarrhea most of the time. Loss of appetite. Lost 23 pounds over the last year.

## 2021-05-10 LAB — CANCER ANTIGEN 27.29: CA 27.29: 17.7 U/mL (ref 0.0–38.6)

## 2021-05-19 ENCOUNTER — Ambulatory Visit
Admission: RE | Admit: 2021-05-19 | Discharge: 2021-05-19 | Disposition: A | Discharge status: Home / Self Care | Payer: Medicare HMO | Source: Ambulatory Visit | Attending: Oncology | Admitting: Oncology

## 2021-05-19 ENCOUNTER — Other Ambulatory Visit: Payer: Self-pay

## 2021-05-19 DIAGNOSIS — C50911 Malignant neoplasm of unspecified site of right female breast: Secondary | ICD-10-CM

## 2021-05-19 DIAGNOSIS — Z1231 Encounter for screening mammogram for malignant neoplasm of breast: Secondary | ICD-10-CM | POA: Diagnosis present

## 2021-05-23 ENCOUNTER — Other Ambulatory Visit: Payer: Self-pay

## 2021-05-23 ENCOUNTER — Ambulatory Visit
Admission: RE | Admit: 2021-05-23 | Discharge: 2021-05-23 | Disposition: A | Discharge status: Home / Self Care | Payer: Medicare HMO | Source: Ambulatory Visit | Attending: Oncology | Admitting: Oncology

## 2021-05-23 DIAGNOSIS — C50911 Malignant neoplasm of unspecified site of right female breast: Secondary | ICD-10-CM | POA: Insufficient documentation

## 2021-05-23 MED ORDER — IOHEXOL 300 MG/ML  SOLN
100.0000 mL | Freq: Once | INTRAMUSCULAR | Status: AC | PRN
  Administered 2021-05-23: 100 mL via INTRAVENOUS
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
## 2021-05-30 ENCOUNTER — Encounter: Payer: Self-pay | Admitting: Oncology

## 2021-05-30 ENCOUNTER — Inpatient Hospital Stay (HOSPITAL_BASED_OUTPATIENT_CLINIC_OR_DEPARTMENT_OTHER): Payer: Medicare HMO | Admitting: Oncology

## 2021-05-30 VITALS — BP 135/82 | HR 72 | Temp 97.1°F | Resp 16 | Wt 170.3 lb

## 2021-05-30 DIAGNOSIS — Z853 Personal history of malignant neoplasm of breast: Secondary | ICD-10-CM | POA: Diagnosis not present

## 2021-05-30 DIAGNOSIS — C50911 Malignant neoplasm of unspecified site of right female breast: Secondary | ICD-10-CM | POA: Diagnosis not present

## 2021-05-30 NOTE — Progress Notes (Signed)
Patient reports her appetite is not well but she has gained 5 lbs.

## 2021-06-20 ENCOUNTER — Emergency Department: Payer: Medicare HMO

## 2021-06-20 ENCOUNTER — Other Ambulatory Visit: Payer: Self-pay

## 2021-06-20 ENCOUNTER — Emergency Department
Admission: EM | Admit: 2021-06-20 | Discharge: 2021-06-20 | Disposition: A | Discharge status: Home / Self Care | Payer: Medicare HMO | Attending: Emergency Medicine | Admitting: Emergency Medicine

## 2021-06-20 DIAGNOSIS — R42 Dizziness and giddiness: Secondary | ICD-10-CM

## 2021-06-20 DIAGNOSIS — Z91010 Allergy to peanuts: Secondary | ICD-10-CM | POA: Insufficient documentation

## 2021-06-20 DIAGNOSIS — Z79899 Other long term (current) drug therapy: Secondary | ICD-10-CM | POA: Insufficient documentation

## 2021-06-20 DIAGNOSIS — I10 Essential (primary) hypertension: Secondary | ICD-10-CM | POA: Insufficient documentation

## 2021-06-20 DIAGNOSIS — R55 Syncope and collapse: Secondary | ICD-10-CM | POA: Insufficient documentation

## 2021-06-20 DIAGNOSIS — Z853 Personal history of malignant neoplasm of breast: Secondary | ICD-10-CM | POA: Insufficient documentation

## 2021-06-20 LAB — BASIC METABOLIC PANEL
Anion gap: 9 (ref 5–15)
BUN: 10 mg/dL (ref 8–23)
CO2: 28 mmol/L (ref 22–32)
Calcium: 9.4 mg/dL (ref 8.9–10.3)
Chloride: 104 mmol/L (ref 98–111)
Creatinine, Ser: 0.62 mg/dL (ref 0.44–1.00)
GFR, Estimated: >60 mL/min (ref >60)
Glucose, Bld: 94 mg/dL (ref 70–99)
Potassium: 3.3 mmol/L — ABNORMAL LOW (ref 3.5–5.1)
Sodium: 141 mmol/L (ref 135–145)

## 2021-06-20 LAB — CBC
HCT: 36.6 % (ref 36.0–46.0)
Hemoglobin: 12 g/dL (ref 12.0–15.0)
MCH: 30.1 pg (ref 26.0–34.0)
MCHC: 32.8 g/dL (ref 30.0–36.0)
MCV: 91.7 fL (ref 80.0–100.0)
Platelets: 228 10*3/uL (ref 150–400)
RBC: 3.99 MIL/uL (ref 3.87–5.11)
RDW: 13.3 % (ref 11.5–15.5)
WBC: 8.2 10*3/uL (ref 4.0–10.5)
nRBC: 0 % (ref 0.0–0.2)

## 2021-06-20 NOTE — ED Triage Notes (Signed)
Pt comes with c/o LOC last night. Pt states she was walking to the bathroom and passed out. Pt states she did loose consciousness. Pt states she hit her right side of head on corner of bathroom door. Pt denies any blood thinners.   Pt states pain to head.

## 2021-06-20 NOTE — Discharge Instructions (Signed)
Please get up slowly when you are getting out of bed.  I did hear a heart murmur on your exam today.  Please follow-up with cardiology for as you may need an echocardiogram to further evaluate your heart valves.
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
## 2022-04-13 ENCOUNTER — Other Ambulatory Visit: Payer: Self-pay | Admitting: Gastroenterology

## 2022-04-13 DIAGNOSIS — Z1231 Encounter for screening mammogram for malignant neoplasm of breast: Secondary | ICD-10-CM

## 2022-04-23 ENCOUNTER — Other Ambulatory Visit: Payer: Self-pay | Admitting: Internal Medicine

## 2022-07-18 ENCOUNTER — Ambulatory Visit
Admission: RE | Admit: 2022-07-18 | Discharge: 2022-07-18 | Disposition: A | Discharge status: Home / Self Care | Payer: Medicare Other | Source: Ambulatory Visit | Attending: Gastroenterology | Admitting: Gastroenterology

## 2022-07-18 DIAGNOSIS — Z1231 Encounter for screening mammogram for malignant neoplasm of breast: Secondary | ICD-10-CM | POA: Diagnosis present

## 2022-07-19 ENCOUNTER — Other Ambulatory Visit: Payer: Self-pay | Admitting: Obstetrics and Gynecology

## 2022-07-19 ENCOUNTER — Other Ambulatory Visit: Payer: Self-pay | Admitting: Gastroenterology

## 2022-07-23 ENCOUNTER — Other Ambulatory Visit: Payer: Self-pay | Admitting: Oncology

## 2022-07-23 DIAGNOSIS — Z1231 Encounter for screening mammogram for malignant neoplasm of breast: Secondary | ICD-10-CM

## 2022-12-03 ENCOUNTER — Telehealth: Payer: Self-pay

## 2022-12-03 NOTE — Telephone Encounter (Signed)
Patient called stating she needs to get an appoiment scheduled to see Dr. Woodfin Ganja. Prefers early Wednesday morning. Please give her a call back at [phone], thanks

## 2022-12-10 ENCOUNTER — Inpatient Hospital Stay: Payer: Medicare HMO | Attending: Oncology | Admitting: Oncology

## 2022-12-10 ENCOUNTER — Encounter: Payer: Self-pay | Admitting: Oncology

## 2022-12-10 VITALS — BP 135/75 | HR 89 | Temp 96.4°F | Resp 18 | Wt 151.4 lb

## 2022-12-10 DIAGNOSIS — C50911 Malignant neoplasm of unspecified site of right female breast: Secondary | ICD-10-CM

## 2022-12-10 DIAGNOSIS — Z923 Personal history of irradiation: Secondary | ICD-10-CM | POA: Diagnosis not present

## 2022-12-10 DIAGNOSIS — I10 Essential (primary) hypertension: Secondary | ICD-10-CM | POA: Diagnosis not present

## 2022-12-10 DIAGNOSIS — Z853 Personal history of malignant neoplasm of breast: Secondary | ICD-10-CM | POA: Diagnosis not present

## 2022-12-10 DIAGNOSIS — Z90710 Acquired absence of both cervix and uterus: Secondary | ICD-10-CM | POA: Insufficient documentation
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
## 2023-05-02 DIAGNOSIS — R634 Abnormal weight loss: Secondary | ICD-10-CM | POA: Diagnosis not present

## 2023-05-02 DIAGNOSIS — I10 Essential (primary) hypertension: Secondary | ICD-10-CM | POA: Diagnosis not present

## 2023-05-02 DIAGNOSIS — Z1389 Encounter for screening for other disorder: Secondary | ICD-10-CM | POA: Diagnosis not present

## 2023-05-02 DIAGNOSIS — K5900 Constipation, unspecified: Secondary | ICD-10-CM | POA: Diagnosis not present

## 2023-05-02 DIAGNOSIS — E329 Disease of thymus, unspecified: Secondary | ICD-10-CM | POA: Diagnosis not present

## 2023-05-02 DIAGNOSIS — Z853 Personal history of malignant neoplasm of breast: Secondary | ICD-10-CM | POA: Diagnosis not present

## 2023-05-02 DIAGNOSIS — Z23 Encounter for immunization: Secondary | ICD-10-CM | POA: Diagnosis not present

## 2023-05-02 DIAGNOSIS — F329 Major depressive disorder, single episode, unspecified: Secondary | ICD-10-CM | POA: Diagnosis not present

## 2023-05-02 DIAGNOSIS — E785 Hyperlipidemia, unspecified: Secondary | ICD-10-CM | POA: Diagnosis not present

## 2023-05-02 DIAGNOSIS — Z1331 Encounter for screening for depression: Secondary | ICD-10-CM | POA: Diagnosis not present

## 2023-05-02 DIAGNOSIS — F0390 Unspecified dementia without behavioral disturbance: Secondary | ICD-10-CM | POA: Diagnosis not present

## 2023-10-17 DIAGNOSIS — R930 Abnormal findings on diagnostic imaging of skull and head, not elsewhere classified: Secondary | ICD-10-CM | POA: Diagnosis not present

## 2023-10-17 DIAGNOSIS — J9811 Atelectasis: Secondary | ICD-10-CM | POA: Diagnosis not present

## 2023-10-17 DIAGNOSIS — Z20822 Contact with and (suspected) exposure to covid-19: Secondary | ICD-10-CM | POA: Diagnosis not present

## 2023-10-17 DIAGNOSIS — Z1152 Encounter for screening for COVID-19: Secondary | ICD-10-CM | POA: Diagnosis not present

## 2023-10-17 DIAGNOSIS — I10 Essential (primary) hypertension: Secondary | ICD-10-CM | POA: Diagnosis not present

## 2023-10-17 DIAGNOSIS — F0390 Unspecified dementia without behavioral disturbance: Secondary | ICD-10-CM | POA: Diagnosis not present
